# Patient Record
Sex: Male | Born: 1990 | Race: White | Hispanic: No | Marital: Single | Smoking: Never smoker
Health system: Southern US, Community
[De-identification: ages and names within clinical notes are randomized; demographics above are authoritative.]

## PROBLEM LIST (undated history)

## (undated) VITALS — BP 129/84 | HR 74 | Temp 97.7°F | Resp 18 | Ht 72.0 in | Wt 270.5 lb

## (undated) DIAGNOSIS — F329 Major depressive disorder, single episode, unspecified: Secondary | ICD-10-CM

## (undated) DIAGNOSIS — F32A Depression, unspecified: Secondary | ICD-10-CM

## (undated) DIAGNOSIS — F259 Schizoaffective disorder, unspecified: Secondary | ICD-10-CM

## (undated) DIAGNOSIS — F909 Attention-deficit hyperactivity disorder, unspecified type: Secondary | ICD-10-CM

## (undated) HISTORY — DX: Schizoaffective disorder, unspecified: F25.9

## (undated) HISTORY — PX: WISDOM TOOTH EXTRACTION: SHX21

## (undated) HISTORY — DX: Attention-deficit hyperactivity disorder, unspecified type: F90.9

## (undated) HISTORY — PX: CHOLECYSTECTOMY: SHX55

---
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
## 2010-01-02 ENCOUNTER — Ambulatory Visit (HOSPITAL_COMMUNITY): Payer: Self-pay | Admitting: Psychiatry

## 2010-01-16 ENCOUNTER — Ambulatory Visit (HOSPITAL_COMMUNITY): Payer: Self-pay | Admitting: Psychiatry

## 2010-01-20 ENCOUNTER — Emergency Department (HOSPITAL_COMMUNITY): Admission: EM | Admit: 2010-01-20 | Discharge: 2010-01-20 | Payer: Self-pay | Admitting: Emergency Medicine

## 2010-01-28 ENCOUNTER — Ambulatory Visit (HOSPITAL_COMMUNITY): Admission: RE | Admit: 2010-01-28 | Discharge: 2010-01-28 | Payer: Self-pay | Admitting: Gastroenterology

## 2010-02-03 ENCOUNTER — Encounter: Admission: RE | Admit: 2010-02-03 | Discharge: 2010-02-03 | Payer: Self-pay | Admitting: Gastroenterology

## 2010-02-06 ENCOUNTER — Ambulatory Visit (HOSPITAL_COMMUNITY): Payer: Self-pay | Admitting: Psychiatry

## 2010-02-19 ENCOUNTER — Emergency Department (HOSPITAL_COMMUNITY): Admission: EM | Admit: 2010-02-19 | Discharge: 2010-02-19 | Payer: Self-pay | Admitting: Emergency Medicine

## 2010-03-10 ENCOUNTER — Ambulatory Visit (HOSPITAL_COMMUNITY): Admission: RE | Admit: 2010-03-10 | Discharge: 2010-03-10 | Payer: Self-pay | Admitting: Surgery

## 2010-03-11 ENCOUNTER — Ambulatory Visit (HOSPITAL_COMMUNITY): Payer: Self-pay | Admitting: Psychiatry

## 2010-04-01 ENCOUNTER — Ambulatory Visit (HOSPITAL_COMMUNITY): Payer: Self-pay | Admitting: Psychiatry

## 2010-06-02 ENCOUNTER — Encounter (HOSPITAL_COMMUNITY): Payer: 59 | Admitting: Psychiatry

## 2010-06-02 DIAGNOSIS — F639 Impulse disorder, unspecified: Secondary | ICD-10-CM

## 2010-06-02 DIAGNOSIS — F333 Major depressive disorder, recurrent, severe with psychotic symptoms: Secondary | ICD-10-CM

## 2010-06-16 ENCOUNTER — Encounter (HOSPITAL_COMMUNITY): Payer: Self-pay | Admitting: Psychiatry

## 2010-06-17 ENCOUNTER — Encounter (HOSPITAL_COMMUNITY): Payer: 59 | Admitting: Psychiatry

## 2010-06-17 DIAGNOSIS — F3342 Major depressive disorder, recurrent, in full remission: Secondary | ICD-10-CM

## 2010-06-17 DIAGNOSIS — F411 Generalized anxiety disorder: Secondary | ICD-10-CM

## 2010-07-01 LAB — SURGICAL PCR SCREEN: Staphylococcus aureus: NEGATIVE

## 2010-07-01 LAB — COMPREHENSIVE METABOLIC PANEL
ALT: 36 U/L (ref 0–53)
Alkaline Phosphatase: 111 U/L (ref 39–117)
CO2: 26 mEq/L (ref 19–32)
GFR calc non Af Amer: >60 mL/min (ref >60)
Glucose, Bld: 99 mg/dL (ref 70–99)
Potassium: 3.9 mEq/L (ref 3.5–5.1)
Sodium: 138 mEq/L (ref 135–145)

## 2010-07-01 LAB — CBC
HCT: 40.3 % (ref 39.0–52.0)
Hemoglobin: 13.7 g/dL (ref 13.0–17.0)
WBC: 6.7 10*3/uL (ref 4.0–10.5)

## 2010-07-01 LAB — DIFFERENTIAL
Basophils Relative: 0 % (ref 0–1)
Eosinophils Absolute: 0.1 10*3/uL (ref 0.0–0.7)
Monocytes Relative: 5 % (ref 3–12)
Neutrophils Relative %: 54 % (ref 43–77)

## 2010-07-03 LAB — CBC
Hemoglobin: 15.6 g/dL (ref 13.0–17.0)
MCH: 27.6 pg (ref 26.0–34.0)
MCHC: 34.2 g/dL (ref 30.0–36.0)
MCV: 80.7 fL (ref 78.0–100.0)

## 2010-07-03 LAB — COMPREHENSIVE METABOLIC PANEL
AST: 43 U/L — ABNORMAL HIGH (ref 0–37)
BUN: 11 mg/dL (ref 6–23)
CO2: 24 mEq/L (ref 19–32)
Calcium: 9.4 mg/dL (ref 8.4–10.5)
Creatinine, Ser: 0.77 mg/dL (ref 0.4–1.5)
GFR calc Af Amer: >60 mL/min (ref >60)
GFR calc non Af Amer: >60 mL/min (ref >60)
Glucose, Bld: 105 mg/dL — ABNORMAL HIGH (ref 70–99)

## 2010-07-03 LAB — POCT CARDIAC MARKERS
CKMB, poc: 4.3 ng/mL (ref 1.0–8.0)
Myoglobin, poc: 129 ng/mL (ref 12–200)
Troponin i, poc: <0.05 ng/mL (ref 0.00–0.09)

## 2010-07-03 LAB — LIPASE, BLOOD: Lipase: 33 U/L (ref 11–59)

## 2010-07-03 LAB — DIFFERENTIAL
Basophils Absolute: 0 10*3/uL (ref 0.0–0.1)
Lymphocytes Relative: 36 % (ref 12–46)
Lymphs Abs: 2.2 10*3/uL (ref 0.7–4.0)
Neutro Abs: 3.3 10*3/uL (ref 1.7–7.7)
Neutrophils Relative %: 55 % (ref 43–77)

## 2010-07-06 LAB — BASIC METABOLIC PANEL
BUN: 18 mg/dL (ref 6–23)
Chloride: 108 mEq/L (ref 96–112)
GFR calc non Af Amer: >60 mL/min (ref >60)
Potassium: 3.5 mEq/L (ref 3.5–5.1)
Sodium: 139 mEq/L (ref 135–145)

## 2010-07-06 LAB — URINALYSIS, ROUTINE W REFLEX MICROSCOPIC
Bilirubin Urine: NEGATIVE
Hgb urine dipstick: NEGATIVE
Ketones, ur: NEGATIVE mg/dL
Nitrite: NEGATIVE
Urobilinogen, UA: 0.2 mg/dL (ref 0.0–1.0)

## 2010-07-06 LAB — CBC
HCT: 42.6 % (ref 39.0–52.0)
Hemoglobin: 14.8 g/dL (ref 13.0–17.0)
MCV: 80.8 fL (ref 78.0–100.0)
RBC: 5.28 MIL/uL (ref 4.22–5.81)
WBC: 10.3 10*3/uL (ref 4.0–10.5)

## 2010-07-06 LAB — COMPREHENSIVE METABOLIC PANEL
ALT: 33 U/L (ref 0–53)
Alkaline Phosphatase: 120 U/L — ABNORMAL HIGH (ref 39–117)
BUN: 15 mg/dL (ref 6–23)
CO2: 26 mEq/L (ref 19–32)
Calcium: 9.3 mg/dL (ref 8.4–10.5)
GFR calc non Af Amer: >60 mL/min (ref >60)
Glucose, Bld: 88 mg/dL (ref 70–99)
Total Protein: 6.9 g/dL (ref 6.0–8.3)

## 2010-07-06 LAB — SALICYLATE LEVEL: Salicylate Lvl: <4 mg/dL (ref 2.8–20.0)

## 2010-07-06 LAB — RAPID URINE DRUG SCREEN, HOSP PERFORMED
Amphetamines: NOT DETECTED
Barbiturates: NOT DETECTED
Opiates: NOT DETECTED

## 2010-07-06 LAB — DIFFERENTIAL
Eosinophils Absolute: 0.1 10*3/uL (ref 0.0–0.7)
Eosinophils Relative: 1 % (ref 0–5)
Lymphocytes Relative: 28 % (ref 12–46)
Lymphs Abs: 2.8 10*3/uL (ref 0.7–4.0)
Monocytes Absolute: 0.7 10*3/uL (ref 0.1–1.0)
Monocytes Relative: 7 % (ref 3–12)

## 2010-07-06 LAB — ETHANOL: Alcohol, Ethyl (B): <5 mg/dL (ref 0–10)

## 2010-07-06 LAB — HEPATIC FUNCTION PANEL
Alkaline Phosphatase: 121 U/L — ABNORMAL HIGH (ref 39–117)
Total Protein: 7.2 g/dL (ref 6.0–8.3)

## 2010-07-06 LAB — ACETAMINOPHEN LEVEL: Acetaminophen (Tylenol), Serum: <10 ug/mL — ABNORMAL LOW (ref 10–30)

## 2010-07-10 ENCOUNTER — Encounter (HOSPITAL_COMMUNITY): Payer: 59 | Admitting: Psychiatry

## 2010-07-10 DIAGNOSIS — F3342 Major depressive disorder, recurrent, in full remission: Secondary | ICD-10-CM

## 2010-07-10 DIAGNOSIS — F639 Impulse disorder, unspecified: Secondary | ICD-10-CM

## 2010-07-23 LAB — DIFFERENTIAL
Basophils Absolute: 0 10*3/uL (ref 0.0–0.1)
Basophils Relative: 1 % (ref 0–1)
Eosinophils Absolute: 0 10*3/uL (ref 0.0–1.2)
Eosinophils Relative: 1 % (ref 0–5)
Eosinophils Relative: 1 % (ref 0–5)
Lymphocytes Relative: 38 % (ref 24–48)
Lymphocytes Relative: 26 % (ref 24–48)
Lymphs Abs: 2.9 10*3/uL (ref 1.1–4.8)
Lymphs Abs: 2.4 10*3/uL (ref 1.1–4.8)
Monocytes Absolute: 0.5 10*3/uL (ref 0.2–1.2)
Monocytes Absolute: 0.6 10*3/uL (ref 0.2–1.2)
Monocytes Relative: 6 % (ref 3–11)
Neutro Abs: 4.1 10*3/uL (ref 1.7–8.0)
Neutro Abs: 6.4 10*3/uL (ref 1.7–8.0)
Neutrophils Relative %: 67 % (ref 43–71)

## 2010-07-23 LAB — COMPREHENSIVE METABOLIC PANEL
ALT: 55 U/L — ABNORMAL HIGH (ref 0–53)
ALT: 48 U/L (ref 0–53)
AST: 53 U/L — ABNORMAL HIGH (ref 0–37)
AST: 43 U/L — ABNORMAL HIGH (ref 0–37)
Albumin: 3.6 g/dL (ref 3.5–5.2)
Albumin: 3.4 g/dL — ABNORMAL LOW (ref 3.5–5.2)
Albumin: 3.7 g/dL (ref 3.5–5.2)
Albumin: 4.1 g/dL (ref 3.5–5.2)
Alkaline Phosphatase: 150 U/L (ref 52–171)
Alkaline Phosphatase: 128 U/L (ref 52–171)
Alkaline Phosphatase: 151 U/L (ref 52–171)
BUN: 12 mg/dL (ref 6–23)
BUN: 10 mg/dL (ref 6–23)
BUN: 15 mg/dL (ref 6–23)
CO2: 22 mEq/L (ref 19–32)
Calcium: 9 mg/dL (ref 8.4–10.5)
Calcium: 9.1 mg/dL (ref 8.4–10.5)
Chloride: 108 mEq/L (ref 96–112)
Chloride: 106 mEq/L (ref 96–112)
Creatinine, Ser: 0.86 mg/dL (ref 0.4–1.5)
Creatinine, Ser: 0.97 mg/dL (ref 0.4–1.5)
Glucose, Bld: 97 mg/dL (ref 70–99)
Glucose, Bld: 101 mg/dL — ABNORMAL HIGH (ref 70–99)
Potassium: 3.8 mEq/L (ref 3.5–5.1)
Potassium: 3.8 mEq/L (ref 3.5–5.1)
Potassium: 3.4 mEq/L — ABNORMAL LOW (ref 3.5–5.1)
Sodium: 141 mEq/L (ref 135–145)
Sodium: 136 mEq/L (ref 135–145)
Sodium: 136 mEq/L (ref 135–145)
Total Bilirubin: 0.7 mg/dL (ref 0.3–1.2)
Total Bilirubin: 0.3 mg/dL (ref 0.3–1.2)
Total Protein: 6.7 g/dL (ref 6.0–8.3)
Total Protein: 6.4 g/dL (ref 6.0–8.3)
Total Protein: 7.2 g/dL (ref 6.0–8.3)

## 2010-07-23 LAB — THYROID ANTIBODIES
Thyroglobulin Ab: 46.7 U/mL (ref 0.0–60.0)
Thyroperoxidase Ab SerPl-aCnc: 65 U/mL — ABNORMAL HIGH (ref 0.0–60.0)

## 2010-07-23 LAB — BASIC METABOLIC PANEL
CO2: 25 mEq/L (ref 19–32)
Chloride: 106 mEq/L (ref 96–112)
Chloride: 109 mEq/L (ref 96–112)
Creatinine, Ser: 0.93 mg/dL (ref 0.4–1.5)
Potassium: 3.6 mEq/L (ref 3.5–5.1)
Potassium: 3.8 mEq/L (ref 3.5–5.1)
Sodium: 140 mEq/L (ref 135–145)

## 2010-07-23 LAB — LIPID PANEL
Cholesterol: 187 mg/dL — ABNORMAL HIGH (ref 0–169)
LDL Cholesterol: 87 mg/dL (ref 0–109)
Total CHOL/HDL Ratio: 6.7 RATIO

## 2010-07-23 LAB — CBC
HCT: 44.8 % (ref 36.0–49.0)
Hemoglobin: 15.2 g/dL (ref 12.0–16.0)
MCHC: 34.5 g/dL (ref 31.0–37.0)
MCHC: 34 g/dL (ref 31.0–37.0)
MCV: 82 fL (ref 78.0–98.0)
MCV: 82.6 fL (ref 78.0–98.0)
Platelets: 188 10*3/uL (ref 150–400)
Platelets: 231 10*3/uL (ref 150–400)
RBC: 5.43 MIL/uL (ref 3.80–5.70)
RDW: 12.8 % (ref 11.4–15.5)
WBC: 9.5 10*3/uL (ref 4.5–13.5)

## 2010-07-23 LAB — BARBITURATE, URINE, CONFIRMATION
Butabarbital UR Quant: NEGATIVE
Pentobarbital GC/MS Conf: NEGATIVE
Secobarbital GC/MS Conf: NEGATIVE

## 2010-07-23 LAB — DRUGS OF ABUSE SCREEN W/O ALC, ROUTINE URINE
Cocaine Metabolites: NEGATIVE
Marijuana Metabolite: NEGATIVE
Opiate Screen, Urine: NEGATIVE
Phencyclidine (PCP): NEGATIVE
Propoxyphene: NEGATIVE

## 2010-07-23 LAB — HEPATIC FUNCTION PANEL
ALT: 67 U/L — ABNORMAL HIGH (ref 0–53)
AST: 54 U/L — ABNORMAL HIGH (ref 0–37)
Albumin: 3.8 g/dL (ref 3.5–5.2)
Alkaline Phosphatase: 141 U/L (ref 52–171)
Bilirubin, Direct: 0.1 mg/dL (ref 0.0–0.3)
Bilirubin, Direct: <0.1 mg/dL (ref 0.0–0.3)
Indirect Bilirubin: 0.4 mg/dL (ref 0.3–0.9)
Indirect Bilirubin: 0.8 mg/dL (ref 0.3–0.9)
Total Bilirubin: 0.9 mg/dL (ref 0.3–1.2)
Total Protein: 6.4 g/dL (ref 6.0–8.3)
Total Protein: 6.7 g/dL (ref 6.0–8.3)
Total Protein: 7 g/dL (ref 6.0–8.3)

## 2010-07-23 LAB — GAMMA GT
GGT: 41 U/L (ref 7–51)
GGT: 44 U/L (ref 7–51)

## 2010-07-23 LAB — URINALYSIS, MICROSCOPIC ONLY
Ketones, ur: NEGATIVE mg/dL
Leukocytes, UA: NEGATIVE
Nitrite: NEGATIVE
Protein, ur: NEGATIVE mg/dL
Urobilinogen, UA: 0.2 mg/dL (ref 0.0–1.0)

## 2010-07-23 LAB — RAPID URINE DRUG SCREEN, HOSP PERFORMED
Amphetamines: NOT DETECTED
Barbiturates: NOT DETECTED
Benzodiazepines: NOT DETECTED
Cocaine: NOT DETECTED
Opiates: NOT DETECTED
Tetrahydrocannabinol: NOT DETECTED

## 2010-07-23 LAB — HEMOGLOBIN A1C: Mean Plasma Glucose: 105 mg/dL

## 2010-07-23 LAB — T3, FREE: T3, Free: 3 pg/mL (ref 2.3–4.2)

## 2010-07-23 LAB — T4, FREE: Free T4: 1.1 ng/dL (ref 0.80–1.80)

## 2010-07-23 LAB — LIPASE, BLOOD: Lipase: 13 U/L (ref 11–59)

## 2010-07-30 LAB — LIPID PANEL
Cholesterol: 162 mg/dL (ref 0–169)
LDL Cholesterol: 108 mg/dL (ref 0–109)

## 2010-07-30 LAB — DIFFERENTIAL
Basophils Absolute: 0 10*3/uL (ref 0.0–0.1)
Lymphocytes Relative: 36 % (ref 24–48)
Lymphs Abs: 3 10*3/uL (ref 1.1–4.8)
Neutro Abs: 4.6 10*3/uL (ref 1.7–8.0)

## 2010-07-30 LAB — COMPREHENSIVE METABOLIC PANEL
AST: 28 U/L (ref 0–37)
CO2: 25 mEq/L (ref 19–32)
Calcium: 9.4 mg/dL (ref 8.4–10.5)
Creatinine, Ser: 0.79 mg/dL (ref 0.4–1.5)
Glucose, Bld: 97 mg/dL (ref 70–99)
Sodium: 141 mEq/L (ref 135–145)
Total Protein: 6.4 g/dL (ref 6.0–8.3)

## 2010-07-30 LAB — CBC
MCHC: 34 g/dL (ref 31.0–37.0)
MCV: 82 fL (ref 78.0–98.0)
RBC: 5.4 MIL/uL (ref 3.80–5.70)
RDW: 13.4 % (ref 11.4–15.5)

## 2010-07-30 LAB — URINALYSIS, ROUTINE W REFLEX MICROSCOPIC
Bilirubin Urine: NEGATIVE
Glucose, UA: NEGATIVE mg/dL
Hgb urine dipstick: NEGATIVE
Ketones, ur: NEGATIVE mg/dL
Nitrite: NEGATIVE
Protein, ur: NEGATIVE mg/dL
Urobilinogen, UA: 0.2 mg/dL (ref 0.0–1.0)
pH: 6 (ref 5.0–8.0)

## 2010-07-30 LAB — CORTISOL-AM, BLOOD: Cortisol - AM: 18.2 ug/dL (ref 4.3–22.4)

## 2010-07-30 LAB — DRUGS OF ABUSE SCREEN W/O ALC, ROUTINE URINE
Amphetamine Screen, Ur: NEGATIVE
Barbiturate Quant, Ur: NEGATIVE
Creatinine,U: 213.5 mg/dL

## 2010-07-30 LAB — T4, FREE: Free T4: 1.16 ng/dL (ref 0.80–1.80)

## 2010-07-30 LAB — HEMOGLOBIN A1C: Mean Plasma Glucose: 105 mg/dL

## 2010-07-30 LAB — TSH: TSH: 1.276 u[IU]/mL (ref 0.350–4.500)
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
## 2010-09-02 NOTE — Procedures (Signed)
EEG NUMBER:  [id number].   REFERRING PHYSICIAN:  Dr. Marlyne Beards.   CLINICAL HISTORY:  A 20 year old male being evaluated for depression,  hallucinations, and possible seizures.   MEDICATION LISTED:  Tylenol, Maalox, vitamin A and D.   This is a portable EEG recorded with the patient awake and asleep using  17-channel machine and standard 10/20 electrode placement.   Background awake rhythm consists of 9-10 Hz alpha, which is of moderate  amplitude, synchronous, reactive to eye opening and closure.  No  paroxysmal epileptiform activity, spikes, or sharp waves are noted.  Stages I and II of light sleep achieved naturally and show normal  physiological findings.  Length of the recording is 21 minutes.  Technical component is average.  EKG tracing reveals regular sinus  rhythm.  Hyperventilation and photic stimulation not performed this  being a portable study.   IMPRESSION:  This EEG performed during awake and light sleep states is  within normal limits.  No definite epileptiform features were noted.           ______________________________  Alejandro Murphy. Pearlean Brownie, MD     UEA:VWUJ  D:  08/03/2008 17:41:08  T:  08/04/2008 10:16:55  Job #:  [id number]
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
## 2010-09-04 ENCOUNTER — Encounter (HOSPITAL_COMMUNITY): Payer: 59 | Admitting: Psychiatry

## 2010-10-06 ENCOUNTER — Encounter (HOSPITAL_COMMUNITY): Payer: 59 | Admitting: Psychiatry

## 2011-08-26 IMAGING — NM NM HEPATO W/GB/PHARM/[PERSON_NAME]
1 series · 12 of 12 positions shown · non-contrast
Comparison: Ultrasound abdomen of 01/20/2010

CLINICAL DATA: Abdominal pain, nausea, vomiting

NUCLEAR MEDICINE HEPATOBILIARY IMAGING WITH GALLBLADDER EF
TECHNIQUE: Sequential images of the abdomen were obtained [DATE] minutes following intravenous administration of
radiopharmaceutical.  After slow intravenous infusion of
micrograms Cholecystokinin, gallbladder ejection fraction was
determined.
Radiopharmaceutical:  5.1 mCi Uc-44m Choletec

[Series 0: hepatobiliary · 3.20mm/px · 2 acquisitions, 12 frames shown]
[im 1/2]
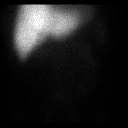
[im 1/2]
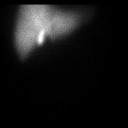
[im 1/2]
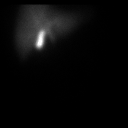
[im 1/2]
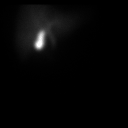
[im 1/2]
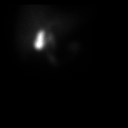
[im 1/2]
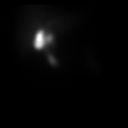
[im 2/2]
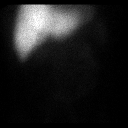
[im 2/2]
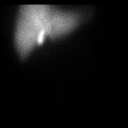
[im 2/2]
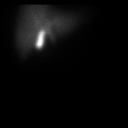
[im 2/2]
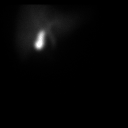
[im 2/2]
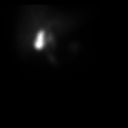
[im 2/2]
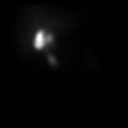

[12 of 12 positions shown; findings below may reference images not displayed]

FINDINGS: The patient was injected with 5.1 mCi of technetium 99m
Choletec intravenously and imaging over the upper abdomen was
performed.  The radionuclide appears within the liver with
excretion into the intrahepatic ductal system.  There is
visualization of the common bile duct, gallbladder, and small
bowel. This represents a normal nuclear medicine hepatobiliary
scan.

The patient was then given 2.6 mcg of CCK intravenously and imaging
over the gallbladder was performed.  At 30 minutes the gallbladder
ejection fraction is measured at 21.8% with normal limits being 30%
or greater.

The patient did not experience symptoms during CCK infusion.
IMPRESSION: 1.  Normal nuclear medicine hepatobiliary scan.
2.  Abnormal gallbladder ejection fraction measured at 21.8% at 30
minutes.

## 2011-10-06 IMAGING — RF DG CHOLANGIOGRAM OPERATIVE
1 series · 4 of 4 positions shown · non-contrast
Comparison: 02/03/2010

CLINICAL DATA: Biliary dyskinesia

INTRAOPERATIVE CHOLANGIOGRAM
TECHNIQUE: Cholangiographic images from the C-arm fluoroscopic
device were submitted for interpretation post-operatively.  Please
see the procedural report for the amount of contrast and the
fluoroscopy time utilized.

[Series 1: run · 4 of 110 frames shown]
[frame 17/110]
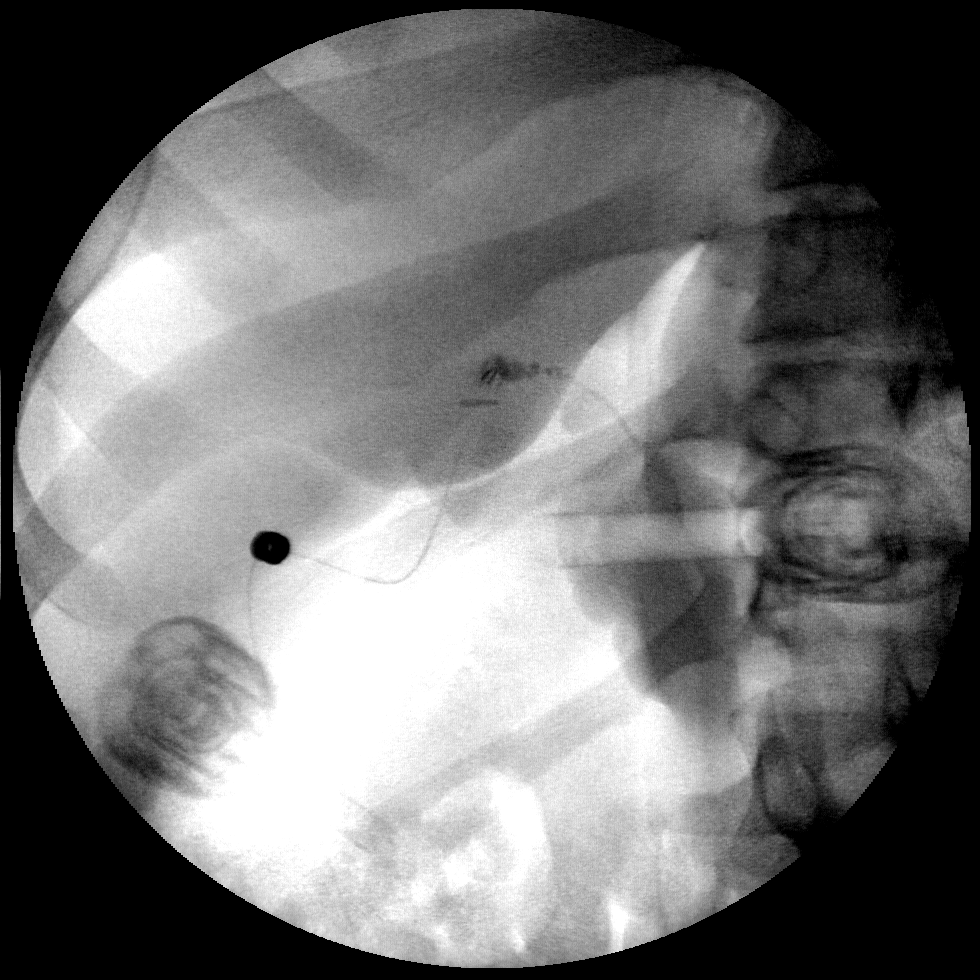
[frame 35/110]
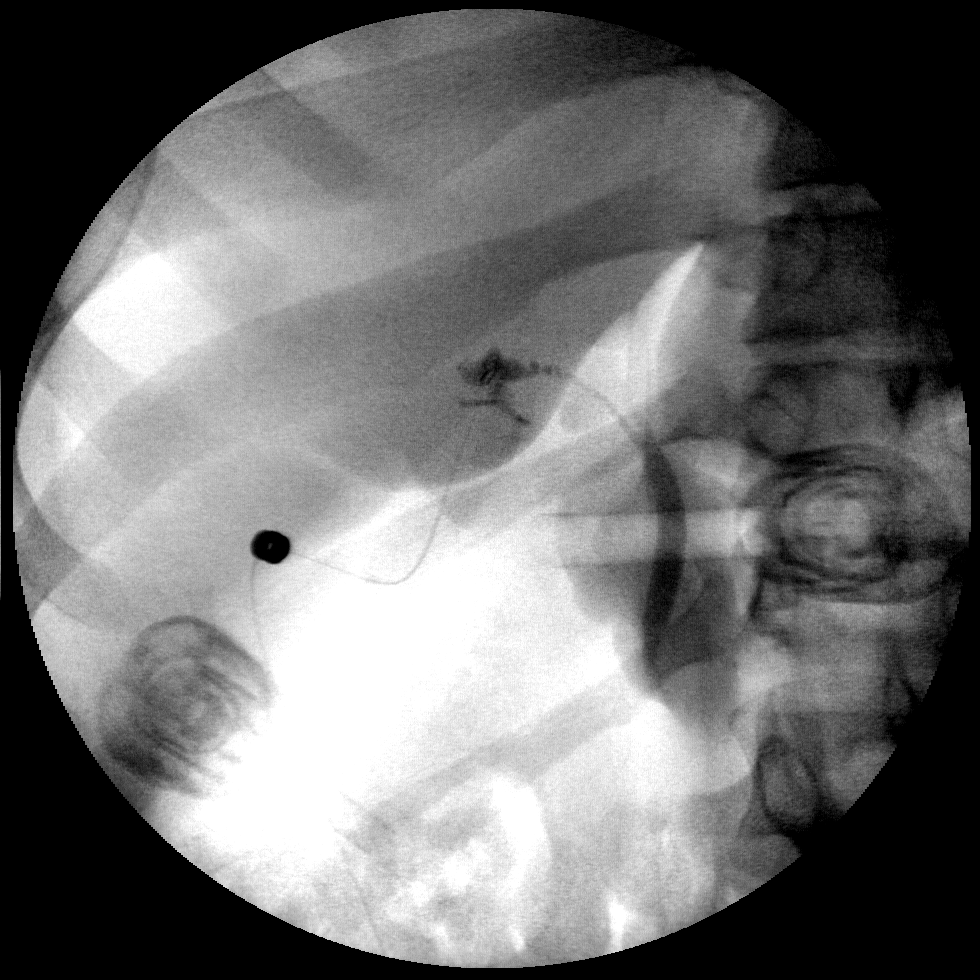
[frame 56/110]
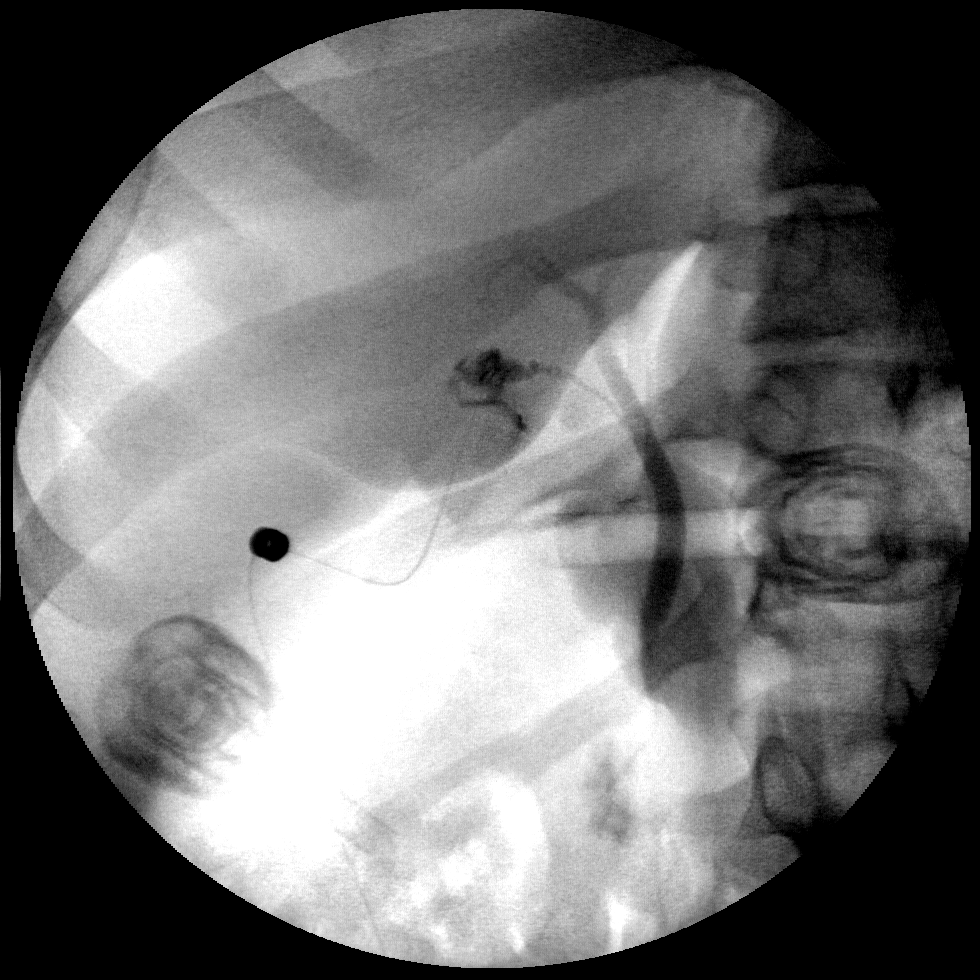
[frame 94/110]
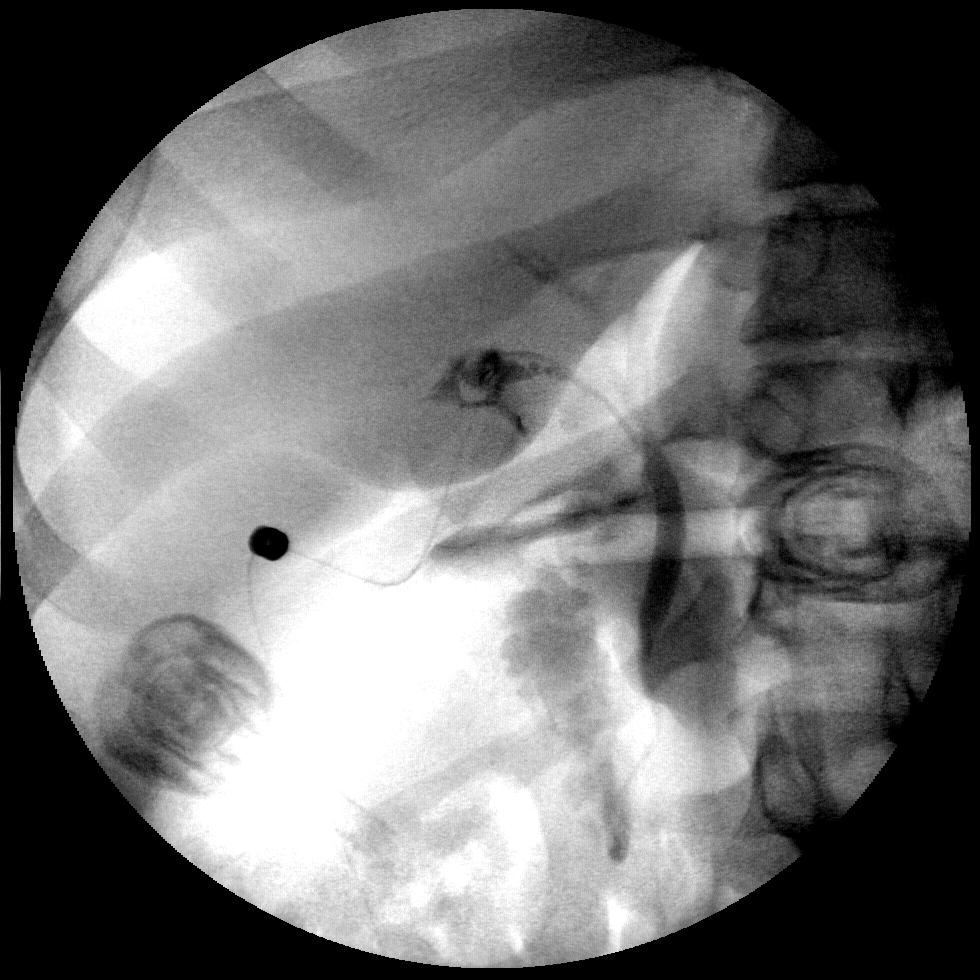

[4 of 4 positions shown; findings below may reference images not displayed]

FINDINGS: Intraoperative cholangiogram performed following
laparoscopic cholecystectomy.  The cystic duct remnant, biliary
confluence, common hepatic duct and common bile duct are all
patent.  Contrast opacifies the duodenum.  No biliary dilatation.
Leakage of contrast noted at the injection site along the cystic
duct clips.
IMPRESSION: Patent biliary system.

## 2012-06-17 ENCOUNTER — Encounter (HOSPITAL_COMMUNITY): Payer: Self-pay | Admitting: *Deleted

## 2012-06-17 ENCOUNTER — Inpatient Hospital Stay (HOSPITAL_COMMUNITY)
Admission: AD | Admit: 2012-06-17 | Discharge: 2012-06-22 | DRG: 885 | Disposition: A | Discharge status: Home / Self Care | Payer: 59 | Attending: Psychiatry | Admitting: Psychiatry

## 2012-06-17 ENCOUNTER — Telehealth (HOSPITAL_COMMUNITY): Payer: Self-pay

## 2012-06-17 DIAGNOSIS — F411 Generalized anxiety disorder: Secondary | ICD-10-CM | POA: Diagnosis present

## 2012-06-17 DIAGNOSIS — F331 Major depressive disorder, recurrent, moderate: Principal | ICD-10-CM

## 2012-06-17 DIAGNOSIS — Z79899 Other long term (current) drug therapy: Secondary | ICD-10-CM

## 2012-06-17 DIAGNOSIS — R45851 Suicidal ideations: Secondary | ICD-10-CM

## 2012-06-17 DIAGNOSIS — F3130 Bipolar disorder, current episode depressed, mild or moderate severity, unspecified: Secondary | ICD-10-CM | POA: Diagnosis present

## 2012-06-17 HISTORY — DX: Depression, unspecified: F32.A

## 2012-06-17 HISTORY — DX: Major depressive disorder, single episode, unspecified: F32.9

## 2012-06-17 LAB — URINALYSIS, ROUTINE W REFLEX MICROSCOPIC
Glucose, UA: NEGATIVE mg/dL
Hgb urine dipstick: NEGATIVE
Leukocytes, UA: NEGATIVE
Protein, ur: NEGATIVE mg/dL
Specific Gravity, Urine: 1.027 (ref 1.005–1.030)
Urobilinogen, UA: 0.2 mg/dL (ref 0.0–1.0)

## 2012-06-17 LAB — RAPID URINE DRUG SCREEN, HOSP PERFORMED
Amphetamines: NOT DETECTED
Cocaine: NOT DETECTED
Opiates: NOT DETECTED
Tetrahydrocannabinol: NOT DETECTED

## 2012-06-17 MED ORDER — ALUM & MAG HYDROXIDE-SIMETH 200-200-20 MG/5ML PO SUSP
30.0000 mL | ORAL | Status: DC | PRN
  Administered 2012-06-19: 30 mL via ORAL

## 2012-06-17 MED ORDER — MAGNESIUM HYDROXIDE 400 MG/5ML PO SUSP: 30.0000 mL | Freq: Every day | ORAL | Status: DC | PRN

## 2012-06-17 MED ORDER — ACETAMINOPHEN 325 MG PO TABS
650.0000 mg | ORAL_TABLET | Freq: Four times a day (QID) | ORAL | Status: DC | PRN
Start: 2012-06-17 — End: 2012-06-22
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
## 2012-06-17 NOTE — Progress Notes (Signed)
Patient has been up in the dayroom watching tv interacting with peers appropriately. Patient attended group this evening and reports that his goal is find better methods to deal with stress once he leaves the hospital. Patient currently denies si/hi/a/v hallucinations. Support and encouragement offered, safety maintained with 15 min checks, will continue to monitor.
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
## 2012-06-17 NOTE — Progress Notes (Signed)
D: Patient appropriate and cooperative with staff and peers.   A: Support and encouragement provided to patient. Monitor Q15 minute checks for safety.  R: Patient receptive. Denies SI/HI/AVH. Patient remains safe.

## 2012-06-18 DIAGNOSIS — F3130 Bipolar disorder, current episode depressed, mild or moderate severity, unspecified: Secondary | ICD-10-CM | POA: Diagnosis present

## 2012-06-18 DIAGNOSIS — F332 Major depressive disorder, recurrent severe without psychotic features: Secondary | ICD-10-CM

## 2012-06-18 DIAGNOSIS — F411 Generalized anxiety disorder: Secondary | ICD-10-CM

## 2012-06-18 LAB — LIPID PANEL
Cholesterol: 177 mg/dL (ref 0–200)
HDL: 27 mg/dL — ABNORMAL LOW (ref >39)
Triglycerides: 214 mg/dL — ABNORMAL HIGH (ref <150)

## 2012-06-18 LAB — COMPREHENSIVE METABOLIC PANEL
CO2: 25 mEq/L (ref 19–32)
Calcium: 9.3 mg/dL (ref 8.4–10.5)
Creatinine, Ser: 0.69 mg/dL (ref 0.50–1.35)
GFR calc Af Amer: >90 mL/min (ref >90)
GFR calc non Af Amer: >90 mL/min (ref >90)
Glucose, Bld: 95 mg/dL (ref 70–99)
Sodium: 138 mEq/L (ref 135–145)
Total Protein: 7.4 g/dL (ref 6.0–8.3)

## 2012-06-18 LAB — CBC
Hemoglobin: 15.1 g/dL (ref 13.0–17.0)
MCH: 27.5 pg (ref 26.0–34.0)
MCHC: 34.3 g/dL (ref 30.0–36.0)
MCV: 80 fL (ref 78.0–100.0)
RBC: 5.5 MIL/uL (ref 4.22–5.81)

## 2012-06-18 LAB — ETHANOL: Alcohol, Ethyl (B): <11 mg/dL (ref 0–11)

## 2012-06-18 MED ORDER — SERTRALINE HCL 25 MG PO TABS
25.0000 mg | ORAL_TABLET | Freq: Every day | ORAL | Status: DC
  Administered 2012-06-18 – 2012-06-20 (×3): 25 mg via ORAL
  Filled 2012-06-18 (×4): qty 1

## 2012-06-18 NOTE — Progress Notes (Signed)
D Kalonji ( who requests to be called " Alejandro Murphy" ) is seen OOB UAL on the 500 hall today, [facility]. He is depressed and sad and does not initiate conversation. HE does deny SI within the past 24 hrs, he rates his depression a " 2 " and does not know what his DC paln is at this point.   AHe is started on 25 mg of zoloft PO, he has not requested prn medication and is attending his groups as planned.   R Safety is in place and POC miantained.

## 2012-06-18 NOTE — Progress Notes (Signed)
[facility] Group Notes:  (Nursing/MHT/Case Management/Adjunct)  Date:  06/17/2012 Time:  2000  Type of Therapy:  Psychoeducational Skills  Participation Level:  Minimal  Participation Quality:  Inattentive  Affect:  Blunted  Cognitive:  Lacking  Insight:  Lacking  Engagement in Group:  Lacking  Modes of Intervention:  Education  Summary of Progress/Problems: The patient did not have anything to share with the group regarding his day. His  goal for tomorrow is to work on finding more coping skills. He mentioned that he feels overwhelmed and stressed out and that he needs to deal with this more appropriately.   Hazle Coca S 06/18/2012, 12:42 AM

## 2012-06-18 NOTE — Progress Notes (Signed)
Psychoeducational Group Note  Date:  06/18/2012 Time: 1015  Group Topic/Focus:  Identifying Needs:   The focus of this group is to help patients identify their personal needs that have been historically problematic and identify healthy behaviors to address their needs.  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

## 2012-06-18 NOTE — [facility] Suicide Risk Assessment (Signed)
Suicide Risk Assessment  Admission Assessment     Nursing information obtained from:  Patient Demographic factors:  Male;Adolescent or young adult Current Mental Status:  Suicidal ideation indicated by patient;Suicidal ideation indicated by others Loss Factors:  Financial problems / change in socioeconomic status Historical Factors:  Impulsivity Risk Reduction Factors:  Sense of responsibility to family;Religious beliefs about death;Living with another person, especially a relative;Positive social support  CLINICAL FACTORS:   Depression:   Anhedonia Hopelessness Insomnia Recent sense of peace/wellbeing Severe  COGNITIVE FEATURES THAT CONTRIBUTE TO RISK:  Loss of executive function Polarized thinking    SUICIDE RISK:   Moderate:  Frequent suicidal ideation with limited intensity, and duration, some specificity in terms of plans, no associated intent, good self-control, limited dysphoria/symptomatology, some risk factors present, and identifiable protective factors, including available and accessible social support.  PLAN OF CARE:  I certify that inpatient services furnished can reasonably be expected to improve the patient's condition.  Makaila Windle T. 06/18/2012, 11:56 AM

## 2012-06-18 NOTE — Progress Notes (Signed)
Patient has been up and active on the unit, attended group this evening and has voiced no complaints. Patient currently denies having pain, -si/hi/a/v hall. Support and encouragement offered, safety maintained on unit, will continue to monitor.  
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
## 2012-06-18 NOTE — Clinical Social Work Note (Addendum)
[facility] Group Notes:  (Clinical Social Work)  06/18/2012   3:00-4:00PM  Summary of Progress/Problems:   The main focus of today's process group was for the patient to identify something in their life that led to their hospitalization that they would like to change, then to discuss their motivation to change.  The Stages of Change were explained to the group, then each patient identified where they are in that process and what actions they are taking toward change within that stage.  The patient expressed he is in the hospital to reinforce his peaceful side, because there is a side of him that becomes very afraid, at which point he explodes in a manner in which he cannot control.  He rages, and is forceful and becomes scary himself.  Therefore he wants to change how he reacts to fear, and how he expresses his anger.  He states that he is in preparation stage and his motivation is 8 out of 10.  Type of Therapy:  Process Group  Participation Level:  Active  Participation Quality:  Attentive, Sharing and Supportive  Affect:  Blunted and Depressed  Cognitive:  Appropriate and Oriented  Insight:  Engaged  Engagement in Therapy:  Engaged  Modes of Intervention:  Clarification, Support and Processing, Exploration, Discussion   Ambrose Mantle, LCSW 06/18/2012, 4:31 PM

## 2012-06-18 NOTE — Progress Notes (Addendum)
Goals  Group Note  Date:  11/28/2011 Time:  0930  Group Topic/Focus:  Identifying  Goals : The  Group is focused on helping the patients  identify goals  They want to obtain as well as to motivate them to make positive change in their lives.  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

## 2012-06-19 DIAGNOSIS — F329 Major depressive disorder, single episode, unspecified: Secondary | ICD-10-CM

## 2012-06-19 DIAGNOSIS — F3289 Other specified depressive episodes: Secondary | ICD-10-CM

## 2012-06-19 MED ORDER — HYDROXYZINE HCL 50 MG PO TABS
50.0000 mg | ORAL_TABLET | Freq: Every evening | ORAL | Status: DC | PRN
  Administered 2012-06-19: 50 mg via ORAL
  Filled 2012-06-19: qty 1

## 2012-06-19 NOTE — Progress Notes (Signed)
Psychoeducational Group Note  Date:  06/19/2012 Time:  1015  Group Topic/Focus:  Making Healthy Choices:   The focus of this group is to help patients identify negative/unhealthy choices they were using prior to admission and identify positive/healthier coping strategies to replace them upon discharge.  Participation Level:  Active  Participation Quality:  Appropriate  Affect:  Appropriate  Cognitive:  Alert  Insight:  Improving  Engagement in Group:  Improving  Additional Comments:    Neomia Herbel A 

## 2012-06-19 NOTE — Progress Notes (Signed)
[facility] Group Notes:  (Nursing/MHT/Case Management/Adjunct)  Date:  06/19/2012  Time:  2000  Type of Therapy:  Psychoeducational Skills  Participation Level:  Active  Participation Quality:  Attentive  Affect:  Appropriate  Cognitive:  Appropriate  Insight:  Improving  Engagement in Group:  Engaged  Modes of Intervention:  Education  Summary of Progress/Problems: Patient stated that he had a "great" day. He stated that he felt calmer than yesterday. In addition, he felt that his mood had improved. His goal for tomorrow is to continue to progress with his mood.   Hazle Coca S 06/19/2012, 10:49 PM

## 2012-06-19 NOTE — Progress Notes (Signed)
[facility] MD Progress Note  06/19/2012 11:51 AM Alejandro Murphy  MRN:  [id number] Subjective:  "I did not sleep well--single sleeper, only child--getting use to that (having someone in the same room), weird to have stranger in the room.  Meds seem to be helping a little bit."  2/10 depression with no anxiety or suicidal ideations. Diagnosis:   Axis I: Depressive Disorder NOS Axis II: Deferred Axis III:  Past Medical History  Diagnosis Date  . Depression    Axis IV: economic problems, occupational problems, other psychosocial or environmental problems, problems related to social environment and problems with primary support group Axis V: 41-50 serious symptoms  ADL's:  Intact  Sleep: Poor  Appetite:  Good  Suicidal Ideation:  Denies Homicidal Ideation:  Denies  Psychiatric Specialty Exam: Review of Systems  Constitutional: Negative.   HENT: Negative.   Eyes: Negative.   Respiratory: Negative.   Cardiovascular: Negative.   Gastrointestinal: Negative.   Genitourinary: Negative.   Musculoskeletal: Negative.   Skin: Negative.   Neurological: Negative.   Endo/Heme/Allergies: Negative.   Psychiatric/Behavioral: Positive for depression. The patient is nervous/anxious.     Blood pressure 130/86, pulse 73, temperature 97.8 F (36.6 C), temperature source Oral, resp. rate 18, height 6' (1.829 m), weight 122.698 kg (270 lb 8 oz).Body mass index is 36.68 kg/(m^2).  General Appearance: Casual  Eye Contact::  Fair  Speech:  Normal Rate  Volume:  Normal  Mood:  Depressed  Affect:  Congruent  Thought Process:  Coherent  Orientation:  Full (Time, Place, and Person)  Thought Content:  WDL  Suicidal Thoughts:  No  Homicidal Thoughts:  No  Memory:  Immediate;   Fair Recent;   Fair Remote;   Fair  Judgement:  Fair  Insight:  Fair  Psychomotor Activity:  Normal  Concentration:  Fair  Recall:  Fair  Akathisia:  No  Handed:  Right  AIMS (if indicated):     Assets:   Housing Resilience Social Support  Sleep:  Number of Hours: 5.25   Current Medications: Current Facility-Administered Medications  Medication Dose Route Frequency Provider Last Rate Last Dose  . acetaminophen (TYLENOL) tablet 650 mg  650 mg Oral Q6H PRN Shuvon Rankin, NP      . alum & mag hydroxide-simeth (MAALOX/MYLANTA) 200-200-20 MG/5ML suspension 30 mL  30 mL Oral Q4H PRN Shuvon Rankin, NP   30 mL at 06/19/12 4098  . magnesium hydroxide (MILK OF MAGNESIA) suspension 30 mL  30 mL Oral Daily PRN Shuvon Rankin, NP      . sertraline (ZOLOFT) tablet 25 mg  25 mg Oral Daily Nanine Means, NP   25 mg at 06/19/12 1191    Lab Results:  Results for orders placed during the hospital encounter of 06/17/12 (from the past 48 hour(s))  URINE RAPID DRUG SCREEN (HOSP PERFORMED)     Status: None   Collection Time    06/17/12  6:23 PM      Result Value Range   Opiates NONE DETECTED  NONE DETECTED   Cocaine NONE DETECTED  NONE DETECTED   Benzodiazepines NONE DETECTED  NONE DETECTED   Amphetamines NONE DETECTED  NONE DETECTED   Tetrahydrocannabinol NONE DETECTED  NONE DETECTED   Barbiturates NONE DETECTED  NONE DETECTED   Comment:            DRUG SCREEN FOR MEDICAL PURPOSES     ONLY.  IF CONFIRMATION IS NEEDED     FOR ANY PURPOSE, NOTIFY LAB  WITHIN 5 DAYS.                LOWEST DETECTABLE LIMITS     FOR URINE DRUG SCREEN     Drug Class       Cutoff (ng/mL)     Amphetamine      1000     Barbiturate      200     Benzodiazepine   200     Tricyclics       300     Opiates          300     Cocaine          300     THC              50  URINALYSIS, ROUTINE W REFLEX MICROSCOPIC     Status: Abnormal   Collection Time    06/17/12  6:24 PM      Result Value Range   Color, Urine YELLOW  YELLOW   APPearance CLOUDY (*) CLEAR   Specific Gravity, Urine 1.027  1.005 - 1.030   pH 5.5  5.0 - 8.0   Glucose, UA NEGATIVE  NEGATIVE mg/dL   Hgb urine dipstick NEGATIVE  NEGATIVE   Bilirubin Urine  NEGATIVE  NEGATIVE   Ketones, ur NEGATIVE  NEGATIVE mg/dL   Protein, ur NEGATIVE  NEGATIVE mg/dL   Urobilinogen, UA 0.2  0.0 - 1.0 mg/dL   Nitrite NEGATIVE  NEGATIVE   Leukocytes, UA NEGATIVE  NEGATIVE   Comment: MICROSCOPIC NOT DONE ON URINES WITH NEGATIVE PROTEIN, BLOOD, LEUKOCYTES, NITRITE, OR GLUCOSE <1000 mg/dL.  CBC     Status: None   Collection Time    06/18/12  6:40 AM      Result Value Range   WBC 7.7  4.0 - 10.5 K/uL   RBC 5.50  4.22 - 5.81 MIL/uL   Hemoglobin 15.1  13.0 - 17.0 g/dL   HCT 16.1  09.6 - 04.5 %   MCV 80.0  78.0 - 100.0 fL   MCH 27.5  26.0 - 34.0 pg   MCHC 34.3  30.0 - 36.0 g/dL   RDW 40.9  81.1 - 91.4 %   Platelets 220  150 - 400 K/uL  COMPREHENSIVE METABOLIC PANEL     Status: None   Collection Time    06/18/12  6:40 AM      Result Value Range   Sodium 138  135 - 145 mEq/L   Potassium 3.8  3.5 - 5.1 mEq/L   Chloride 103  96 - 112 mEq/L   CO2 25  19 - 32 mEq/L   Glucose, Bld 95  70 - 99 mg/dL   BUN 12  6 - 23 mg/dL   Creatinine, Ser 7.82  0.50 - 1.35 mg/dL   Calcium 9.3  8.4 - 95.6 mg/dL   Total Protein 7.4  6.0 - 8.3 g/dL   Albumin 3.8  3.5 - 5.2 g/dL   AST 28  0 - 37 U/L   ALT 28  0 - 53 U/L   Alkaline Phosphatase 108  39 - 117 U/L   Total Bilirubin 0.4  0.3 - 1.2 mg/dL   GFR calc non Af Amer >90  >90 mL/min   GFR calc Af Amer >90  >90 mL/min   Comment:            The eGFR has been calculated     using the CKD EPI equation.     This calculation  has not been     validated in all clinical     situations.     eGFR's persistently     <90 mL/min signify     possible Chronic Kidney Disease.  ETHANOL     Status: None   Collection Time    06/18/12  6:40 AM      Result Value Range   Alcohol, Ethyl (B) <11  0 - 11 mg/dL   Comment:            LOWEST DETECTABLE LIMIT FOR     SERUM ALCOHOL IS 11 mg/dL     FOR MEDICAL PURPOSES ONLY  LIPID PANEL     Status: Abnormal   Collection Time    06/18/12  6:40 AM      Result Value Range   Cholesterol 177   0 - 200 mg/dL   Triglycerides 784 (*) <150 mg/dL   HDL 27 (*) >69 mg/dL   Total CHOL/HDL Ratio 6.6     VLDL 43 (*) 0 - 40 mg/dL   LDL Cholesterol 629 (*) 0 - 99 mg/dL   Comment:            Total Cholesterol/HDL:CHD Risk     Coronary Heart Disease Risk Table                         Men   Women      1/2 Average Risk   3.4   3.3      Average Risk       5.0   4.4      2 X Average Risk   9.6   7.1      3 X Average Risk  23.4   11.0                Use the calculated Patient Ratio     above and the CHD Risk Table     to determine the patient's CHD Risk.                ATP III CLASSIFICATION (LDL):      <100     mg/dL   Optimal      528-413  mg/dL   Near or Above                        Optimal      130-159  mg/dL   Borderline      244-010  mg/dL   High      >272     mg/dL   Very High  MAGNESIUM     Status: None   Collection Time    06/18/12  6:40 AM      Result Value Range   Magnesium 2.0  1.5 - 2.5 mg/dL  TSH     Status: None   Collection Time    06/18/12  6:40 AM      Result Value Range   TSH 0.899  0.350 - 4.500 uIU/mL    Physical Findings: AIMS: Facial and Oral Movements Muscles of Facial Expression: None, normal Lips and Perioral Area: None, normal Jaw: None, normal Tongue: None, normal,Extremity Movements Upper (arms, wrists, hands, fingers): None, normal Lower (legs, knees, ankles, toes): None, normal, Trunk Movements Neck, shoulders, hips: None, normal, Overall Severity Severity of abnormal movements (highest score from questions above): None, normal Incapacitation due to abnormal movements: None, normal Patient's awareness of abnormal movements (rate only patient's  report): No Awareness, Dental Status Current problems with teeth and/or dentures?: No Does patient usually wear dentures?: No  CIWA:    COWS:     Treatment Plan Summary: Daily contact with patient to assess and evaluate symptoms and progress in treatment Medication management  Plan:  Review of  chart, vital signs, medications, and notes. 1-Individual and group therapy 2-Medication management for depression and anxiety:  Medications reviewed with the patient and no changes made 3-Coping skills for depression and anxiety 4-Continue crisis stabilization and management 5-Address health issues--monitoring vital signs, stable 6-Treatment plan in progress to prevent relapse of depression and anxiety  Medical Decision Making Problem Points:  Established problem, stable/improving (1) and Review of psycho-social stressors (1) Data Points:  Review of medication regiment & side effects (2)  I certify that inpatient services furnished can reasonably be expected to improve the patient's condition.   Nanine Means, PMH-NP 06/19/2012, 11:51 AM

## 2012-06-19 NOTE — Progress Notes (Signed)
Writer spoke with patient earlier and he reports that his day has gone well and he has been playing basketball the past few days and has been getting his exercise in. Patient has been up and active on the unit and attended group. Patient asked if he had medication for sleep tonight since he did not rest well due to his roommates loud snoring. Writer called doctor on call and obtained an order for visteril to aid with sleep. Patient reports feeling slightly anxious when we were talking. Patient offered support and encouragement, denies si/hi/a/v hallucinations. Safety maintained on unit with 15 min checks, will continue to monitor.

## 2012-06-19 NOTE — Clinical Social Work Note (Signed)
[facility] Group Notes: (Clinical Social Work)   06/19/2012      Type of Therapy:  Group Therapy   Participation Level:  Did Not Attend    Ambrose Mantle, LCSW 06/19/2012, 4:14 PM

## 2012-06-19 NOTE — Progress Notes (Signed)
Alejandro Murphy remains sad, depressed and flat. Alejandro Murphy pays attention during his groups, is engaged in the group discussions, but remins slightly " aloof" to the real issue, as related to his hospitalization. While Alejandro Murphy is cooperative and very pleasant to deal with , Alejandro Murphy admits Alejandro Murphy has a " vast" anxiety " that's always there..." and Alejandro Murphy always feels somewhat uncomfortable, anticiaptaing when it will reach unmanageable levels for him...    A Alejandro Murphy takes his scheduled meds as they are ordered and Alejandro Murphy attends his groups and Alejandro Murphy shares his feelings, but continues to minimize the seriousness of his feelings, as they were prior to his admission here in this facility.    R Safety is in place and POC includes continuing  to foster therapeutic relationship.

## 2012-06-19 NOTE — Progress Notes (Signed)
Group Topic/Focus:  Gratefulness:  The focus of this group is to help patients identify what two things they are most grateful for in their lives. What helps ground them and to center them on their work to their recovery.  Participation Level:  Active  Participation Quality:  Attentive  Affect:  Appropriate  Cognitive:  Alert  Insight:  Engaged  Engagement in Group:  Engaged  Additional Comments:  Pt stated that he was grateful for his good health. Dione Housekeeper

## 2012-06-20 MED ORDER — HYDROXYZINE HCL 25 MG PO TABS
25.0000 mg | ORAL_TABLET | Freq: Every evening | ORAL | Status: DC | PRN
  Administered 2012-06-20: 25 mg via ORAL

## 2012-06-20 MED ORDER — SERTRALINE HCL 50 MG PO TABS
50.0000 mg | ORAL_TABLET | Freq: Every day | ORAL | Status: DC
  Administered 2012-06-21 – 2012-06-22 (×2): 50 mg via ORAL
  Filled 2012-06-20 (×3): qty 1

## 2012-06-20 NOTE — Progress Notes (Signed)
Patient ID: Alejandro Murphy, male   DOB: [birth year], 22 y.o.   MRN: [id number] D: Pt. Reports denies concerns tonight "actually I'm having a lot of fun", reports groups been helpful found that he has problems with relationship, but handle things well spiritually. Pt. Says he learned from relationships "If you gonna be dumb, got to be tough". A: Writer introduced self to client, provided emotional support by listening and encouraging client to use coping skills learned in relationship issues. Staff will monitor q81min for safety. R: Pt. Is safe on the unit. Pt. Attended groups.

## 2012-06-20 NOTE — Progress Notes (Signed)
Adult Psychoeducational Group Note  Date:  06/20/2012 Time:  1:21 PM  Group Topic/Focus:  Self Care:   The focus of this group is to help patients understand the importance of self-care in order to improve or restore emotional, physical, spiritual, interpersonal, and financial health.  Participation Level:  Active  Participation Quality:  Appropriate and Attentive  Affect:  Appropriate  Cognitive:  Alert and Appropriate  Insight: Appropriate  Engagement in Group:  Engaged  Modes of Intervention:  Discussion  Additional Comments:  Pt was appropriate and sharing while attending group. Pt stated that avoiding negativity is what self-care meant to him. Pt also communicated that staying in contact with faraway friends was something that he did well as it relates to relationship self-care and he wants to improve on allowing others to do things for him.   Sharyn Lull 06/20/2012, 1:21 PM

## 2012-06-20 NOTE — Progress Notes (Signed)
[facility] MD Progress Note  06/20/2012 12:33 PM Alejandro Murphy  MRN:  [id number] Subjective:  Patient reports feeling down. States he does not feel the effects of zoloft yet. He reports his previous hospitalization was when he cut on his wrist and states he was on multiple medications at the time. He is wary about being on multiple medications. Feels he might benefit from a counselor who is faith based. Diagnosis:   Axis I: Major Depression, Recurrent severe Axis II: Deferred Axis III:  Past Medical History  Diagnosis Date  . Depression    Axis IV: educational problems, occupational problems and other psychosocial or environmental problems Axis V: 41-50 serious symptoms  ADL's:  Intact  Sleep: Fair  Appetite:  Fair   Psychiatric Specialty Exam: Review of Systems  Constitutional: Negative.   HENT: Negative.   Eyes: Negative.   Respiratory: Negative.   Cardiovascular: Negative.   Gastrointestinal: Negative.   Genitourinary: Negative.   Musculoskeletal: Negative.   Skin: Negative.   Neurological: Negative.   Endo/Heme/Allergies: Negative.   Psychiatric/Behavioral: Positive for depression. The patient is nervous/anxious.     Blood pressure 133/85, pulse 61, temperature 96.7 F (35.9 C), temperature source Oral, resp. rate 16, height 6' (1.829 m), weight 122.698 kg (270 lb 8 oz).Body mass index is 36.68 kg/(m^2).  General Appearance: Casual  Eye Contact::  Fair  Speech:  Clear and Coherent  Volume:  Decreased  Mood:  Depressed and Dysphoric  Affect:  Congruent  Thought Process:  Coherent  Orientation:  Full (Time, Place, and Person)  Thought Content:  WDL  Suicidal Thoughts:  No  Homicidal Thoughts:  No  Memory:  Immediate;   Fair Recent;   Fair Remote;   Fair  Judgement:  Fair  Insight:  Present  Psychomotor Activity:  Normal  Concentration:  Fair  Recall:  Fair  Akathisia:  No  Handed:  Right  AIMS (if indicated):     Assets:  Communication Skills Desire for  Improvement Housing Social Support  Sleep:  Number of Hours: 6   Current Medications: Current Facility-Administered Medications  Medication Dose Route Frequency Provider Last Rate Last Dose  . acetaminophen (TYLENOL) tablet 650 mg  650 mg Oral Q6H PRN Shuvon Rankin, NP      . alum & mag hydroxide-simeth (MAALOX/MYLANTA) 200-200-20 MG/5ML suspension 30 mL  30 mL Oral Q4H PRN Shuvon Rankin, NP   30 mL at 06/19/12 0652  . hydrOXYzine (ATARAX/VISTARIL) tablet 50 mg  50 mg Oral QHS PRN,MR X 1 Cleotis Nipper, MD   50 mg at 06/19/12 2220  . magnesium hydroxide (MILK OF MAGNESIA) suspension 30 mL  30 mL Oral Daily PRN Shuvon Rankin, NP      . [START ON 06/21/2012] sertraline (ZOLOFT) tablet 50 mg  50 mg Oral Daily [name], MD        Lab Results: No results found for this or any previous visit (from the past 48 hour(s)).  Physical Findings: AIMS: Facial and Oral Movements Muscles of Facial Expression: None, normal Lips and Perioral Area: None, normal Jaw: None, normal Tongue: None, normal,Extremity Movements Upper (arms, wrists, hands, fingers): None, normal Lower (legs, knees, ankles, toes): None, normal, Trunk Movements Neck, shoulders, hips: None, normal, Overall Severity Severity of abnormal movements (highest score from questions above): None, normal Incapacitation due to abnormal movements: None, normal Patient's awareness of abnormal movements (rate only patient's report): No Awareness, Dental Status Current problems with teeth and/or dentures?: No Does patient usually wear dentures?: No  CIWA:    COWS:     Treatment Plan Summary: Daily contact with patient to assess and evaluate symptoms and progress in treatment Medication management  Plan: Increase zoloft to 50mg . Side effects and benefits discussed. Educated about medications and the role they play incombination with therapy. Patient willing to try a faith based counsellor. Plan for discharge as patient  stabilizes.  Medical Decision Making Problem Points:  Established problem, stable/improving (1), Review of last therapy session (1) and Review of psycho-social stressors (1) Data Points:  Review of medication regiment & side effects (2) Review of new medications or change in dosage (2)  I certify that inpatient services furnished can reasonably be expected to improve the patient's condition.   RAVI, HIMABINDU 06/20/2012, 12:33 PM

## 2012-06-20 NOTE — Progress Notes (Signed)
[facility] LCSW Aftercare Discharge Planning Group Note  06/20/2012 3:08 PM  Participation Quality:  Appropriate and Attentive  Affect:  Appropriate and Depressed  Cognitive:  Appropriate  Insight:  Developing/Improving  Engagement in Group:  Developing/Improving  Modes of Intervention:  Exploration, Problem-solving, Rapport Building and Support  Summary of Progress/Problems:  Patient shared he admitted to the hospital due to stress.  He shared he he was feeling HI but not toward anyone in particular.  He currently denies SI/HI and advised that he had not seen anyone outpatient in a year or so.  He plans to return to his parent's home at discharge.  Wynn Banker 06/20/2012, 3:08 PM

## 2012-06-20 NOTE — Progress Notes (Signed)
D:  Alejandro Murphy reports that he slept well and his appetite is good.  His energy level is normal and his ability to pay attention is good.  He is rating hopelessness and depression at 1/10.  He denies SI/HI/AVH at this time.  He is attending groups and is interacting appropriately with staff and others. A:  Medications administered as ordered.  Safety checks q 15 minutes.  Emotional support provided. R:  Safety maintained on unit.

## 2012-06-20 NOTE — Progress Notes (Signed)
Adult Psychoeducational Group Note  Date:  06/20/2012 Time:  2015   Group Topic/Focus:  Wrap-Up Group:   The focus of this group is to help patients review their daily goal of treatment and discuss progress on daily workbooks.  Participation Level:  Active  Participation Quality:  Appropriate  Affect:  Appropriate  Cognitive:  Appropriate  Insight: Improving  Engagement in Group:  Developing/Improving and Engaged  Modes of Intervention:  Discussion and Support  Additional Comments:  Pt. Stated he had an okay day and was able to maintain being positive by keeping his self occupy by engaging in different activity and attending groups  Gwenevere Ghazi Patience 06/20/2012, 10:41 PM

## 2012-06-20 NOTE — Progress Notes (Signed)
[facility] LCSW Group Therapy  06/20/2012 3:10 PM  Type of Therapy:  Group Therapy  Participation Level:  Active  Participation Quality:  Appropriate  Affect:  Appropriate  Cognitive:  Appropriate  Insight:  Developing/Improving  Engagement in Therapy:  Developing/Improving  Modes of Intervention:  Discussion, Exploration, Problem-solving, Rapport Building and Support  Summary of Progress/Problems:  Patient shared he is not certain what his obstacle is.  He advised of admitting to the hospital to have time for clarity to figure out his thoughts.  He shared he does not have a place he can be alone to think at home. Alejandro Murphy 06/20/2012, 3:10 PM

## 2012-06-20 NOTE — [facility] Counselor (Signed)
Adult Comprehensive Assessment  Patient ID: SHANTA DORVIL, male   DOB: May 23, 1990, 22 y.o.   MRN: [id number]  Information Source: Information source: Patient  Current Stressors:  Educational / Learning stressors: None Employment / Job issues: Patient is unemployed Family Relationships: None Surveyor, quantity / Lack of resources (include bankruptcy): Difficulty due to no source of income Housing / Lack of housing: None Physical health (include injuries & life threatening diseases): None Social relationships: None Substance abuse: Drinks alcohol on ocassion Bereavement / Loss: None  Living/Environment/Situation:  Living Arrangements: Parent Living conditions (as described by patient or guardian): Good How long has patient lived in current situation?: All of his life What is atmosphere in current home: Comfortable;Supportive  Family History:  Marital status: Single Does patient have children?: No  Childhood History:  By whom was/is the patient raised?: Both parents Additional childhood history information: Good Description of patient's relationship with caregiver when they were a child: Very good Patient's description of current relationship with people who raised him/her: Very good Does patient have siblings?: No Did patient suffer any verbal/emotional/physical/sexual abuse as a child?: No Did patient suffer from severe childhood neglect?: No Has patient ever been sexually abused/assaulted/raped as an adolescent or adult?: No Was the patient ever a victim of a crime or a disaster?: No Witnessed domestic violence?: No Has patient been effected by domestic violence as an adult?: No  Education:  Highest grade of school patient has completed: 12th Currently a student?: No Learning disability?: No  Employment/Work Situation:   Employment situation: Unemployed Patient's job has been impacted by current illness: No What is the longest time patient has a held a job?: Patient reports he  has never worked Where was the patient employed at that time?: N/A Has patient ever been in the [employer]?: No Has patient ever served in Buyer, retail?: No  Financial Resources:   Surveyor, quantity resources: No income Does patient have a Lawyer or guardian?: No  Alcohol/Substance Abuse:   What has been your use of drugs/alcohol within the last 12 months?: Patient reports he may drink alcohol once a month socially If attempted suicide, did drugs/alcohol play a role in this?: No Alcohol/Substance Abuse Treatment Hx: Denies past history Has alcohol/substance abuse ever caused legal problems?: No  Social Support System:   Forensic psychologist System: None Type of faith/religion: None  How does patient's faith help to cope with current illness?: Patient shared dhe stopped attending church after grandfather died  Leisure/Recreation:   Leisure and Hobbies: Thinker - Meditate  Strengths/Needs:   What things does the patient do well?: Good Cook In what areas does patient struggle / problems for patient: Finding a job  Discharge Plan:   Does patient have access to transportation?: Yes Will patient be returning to same living situation after discharge?: Yes Currently receiving community mental health services: No If no, would patient like referral for services when discharged?: Yes (What county?) [facility] [place]) Does patient have financial barriers related to discharge medications?: No  Summary/Recommendations:   Griffith Santilli is a 22 years old Caucasian male admitted with Major Depression Disorder.  He will benefit from crisis stabilization, evaluation for medication, psycho-education groups for coping skills development, group therapy and case management for discharge planning.     Hodnett, Joesph July. 06/20/2012

## 2012-06-21 NOTE — Progress Notes (Signed)
[facility] LCSW Group Therapy  06/21/2012 3:51 PM  Type of Therapy:  Group Therapy  Participation Level:  Active  Participation Quality:  Appropriate  Affect:  Appropriate  Cognitive:  Appropriate  Insight:  Developing/Improving  Engagement in Therapy:  Developing/Improving  Modes of Intervention:  Discussion, Exploration, Problem-solving, Rapport Building and Support  Summary of Progress/Problems:  Patient shared he has an ongoing battle with accepting his diagnosis and doing what is needed to take care of himself.     Wynn Banker 06/21/2012, 3:51 PM

## 2012-06-21 NOTE — Progress Notes (Signed)
Grief and Loss Group  Patients discussed significant losses and grief in there lives.  Patients explored ways to cope effectively with loss and associated grief emotions.    Pt was present in group and attentive to group members as they shared.  Akers, Whitney Counseling Intern [employer] 

## 2012-06-21 NOTE — Progress Notes (Signed)
Adult Psychoeducational Group Note  Date:  06/21/2012 Time:  1:39 PM  Group Topic/Focus:  Recovery Goals:   The focus of this group is to identify appropriate goals for recovery and establish a plan to achieve them.  Participation Level:  Active  Participation Quality:  Appropriate, Attentive and Sharing  Affect:  Appropriate  Cognitive:  Alert and Appropriate  Insight: Appropriate  Engagement in Group:  Engaged  Modes of Intervention:  Discussion  Additional Comments:  Pt. Was appropriate and attentive while in group discussion. Pt. Was able to share with group about What is Recovery. Pt. Stated that he is willing to work on mental state, being assertive, and engaging in positive social activities. Pt. Shared ways of improving goal by keeping positive people around and staying positive.   Sharyn Lull 06/21/2012, 1:39 PM

## 2012-06-21 NOTE — Progress Notes (Signed)
[facility] LCSW Aftercare Discharge Planning Group Note  06/21/2012 12:14 PM  Participation Quality:  Appropriate and Attentive  Affect:  Appropriate and Depressed  Cognitive:  Appropriate  Insight:  Developing/Improving  Engagement in Group:  Developing/Improving  Modes of Intervention:  Exploration, Problem-solving, Rapport Building and Support  Summary of Progress/Problems:  Patient shared doing okay today.  He denied SI/HI and rates depression and anxiety one.  Patient stated he will be willing to follow up with outpatient services at [facility].     Wynn Banker 06/21/2012, 12:14 PM

## 2012-06-21 NOTE — Progress Notes (Signed)
D:  Alejandro Murphy reports that he slept well and his appetite is good.  He is rating depression and hopelessness at 0/10.  He denies SI/HI/AVH at this time.  He is attending groups and is interacting appropriately with staff and other patients.  A:  Medications administered as ordered.  Emotional support provided.  Safety checks q 15 minutes. R:  Safety maintained on unit.

## 2012-06-21 NOTE — Progress Notes (Signed)
[facility] MD Progress Note  06/21/2012 11:30 AM Alejandro Murphy  MRN:  [id number] Subjective:  Patient reports he is depressed, feels numb. Was able to share his feelings in group today. Tolerating the increase in Zoloft well, denies side effects.    Diagnosis:   Axis I: Major Depression, Recurrent severe Axis II: Deferred Axis III:  Past Medical History  Diagnosis Date  . Depression    Axis IV: occupational problems and other psychosocial or environmental problems Axis V: 41-50 serious symptoms  ADL's:  Intact  Sleep: Fair  Appetite:  Fair   Psychiatric Specialty Exam: Review of Systems  Constitutional: Negative.   HENT: Negative.   Eyes: Negative.   Respiratory: Negative.   Cardiovascular: Negative.   Gastrointestinal: Negative.   Genitourinary: Negative.   Musculoskeletal: Negative.   Skin: Negative.   Neurological: Negative.   Endo/Heme/Allergies: Negative.   Psychiatric/Behavioral: Positive for depression. The patient is nervous/anxious.     Blood pressure 133/84, pulse 83, temperature 97.8 F (36.6 C), temperature source Oral, resp. rate 20, height 6' (1.829 m), weight 122.698 kg (270 lb 8 oz).Body mass index is 36.68 kg/(m^2).  General Appearance: Disheveled  Eye Solicitor::  Fair  Speech:  Clear and Coherent  Volume:  Normal  Mood:  Anxious and Dysphoric  Affect:  Constricted  Thought Process:  Circumstantial  Orientation:  Full (Time, Place, and Person)  Thought Content:  Rumination  Suicidal Thoughts:  No  Homicidal Thoughts:  No  Memory:  Immediate;   Fair Recent;   Fair Remote;   Fair  Judgement:  Fair  Insight:  Present  Psychomotor Activity:  Decreased  Concentration:  Fair  Recall:  Fair  Akathisia:  No  Handed:  Right  AIMS (if indicated):     Assets:  Communication Skills Desire for Improvement Social Support  Sleep:  Number of Hours: 6.75   Current Medications: Current Facility-Administered Medications  Medication Dose Route Frequency  Jaysean Manville Last Rate Last Dose  . acetaminophen (TYLENOL) tablet 650 mg  650 mg Oral Q6H PRN Shuvon Rankin, NP      . alum & mag hydroxide-simeth (MAALOX/MYLANTA) 200-200-20 MG/5ML suspension 30 mL  30 mL Oral Q4H PRN Shuvon Rankin, NP   30 mL at 06/19/12 0652  . hydrOXYzine (ATARAX/VISTARIL) tablet 25 mg  25 mg Oral QHS PRN,MR X 1 Kerry Hough, PA   25 mg at 06/20/12 2235  . magnesium hydroxide (MILK OF MAGNESIA) suspension 30 mL  30 mL Oral Daily PRN Shuvon Rankin, NP      . sertraline (ZOLOFT) tablet 50 mg  50 mg Oral Daily [name], MD   50 mg at 06/21/12 7829    Lab Results: No results found for this or any previous visit (from the past 48 hour(s)).  Physical Findings: AIMS: Facial and Oral Movements Muscles of Facial Expression: None, normal Lips and Perioral Area: None, normal Jaw: None, normal Tongue: None, normal,Extremity Movements Upper (arms, wrists, hands, fingers): None, normal Lower (legs, knees, ankles, toes): None, normal, Trunk Movements Neck, shoulders, hips: None, normal, Overall Severity Severity of abnormal movements (highest score from questions above): None, normal Incapacitation due to abnormal movements: None, normal Patient's awareness of abnormal movements (rate only patient's report): No Awareness, Dental Status Current problems with teeth and/or dentures?: No Does patient usually wear dentures?: No  CIWA:  CIWA-Ar Total: 0 COWS:     Treatment Plan Summary: Daily contact with patient to assess and evaluate symptoms and progress in treatment Medication management  Plan: Continue current plan of care. Plan for discharge once stable.  Medical Decision Making Problem Points:  Established problem, stable/improving (1), Review of last therapy session (1) and Review of psycho-social stressors (1) Data Points:  Review of medication regiment & side effects (2)  I certify that inpatient services furnished can reasonably be expected to improve the  patient's condition.   RAVI, HIMABINDU 06/21/2012, 11:30 AM

## 2012-06-21 NOTE — Progress Notes (Signed)
D: Patient in the dayroom engaged in conversation with a peer on approach.  Patient states , "I feel fine I am just tired."  Patient states the medications he has taken at night has him tired throughout the day.  Patient states he also thinks he has had years of bad sleep habits.  Patient states when he is discharged he is going to apply for school at [employer].  Patient rates depression 5/10.  Patient denies SI/HI and denies AVH.  A: Staff to monitor Q 15 mins for safety.  Encouragement and support offered.  No medications administered tonight R: Patient remains safe on the unit.  Patient attended group tonight.  Patient visible on the unit and interacting with peers.  No medications administered tonight.

## 2012-06-22 DIAGNOSIS — F331 Major depressive disorder, recurrent, moderate: Principal | ICD-10-CM

## 2012-06-22 MED ORDER — HYDROXYZINE HCL 25 MG PO TABS: 25.0000 mg | ORAL_TABLET | Freq: Every evening | ORAL | Status: DC | PRN

## 2012-06-22 MED ORDER — SERTRALINE HCL 50 MG PO TABS: 50.0000 mg | ORAL_TABLET | Freq: Every day | ORAL | Status: DC

## 2012-06-22 NOTE — Progress Notes (Signed)
[facility] INPATIENT:  Family/Significant Other Suicide Prevention Education  Suicide Prevention Education:  Education Completed; Gunnar Fusi, Mother, [facility] Harlan Stains has been identified by the patient as the family member/significant other with whom the patient will be residing, and identified as the person(s) who will aid the patient in the event of a mental health crisis (suicidal ideations/suicide attempt).  With written consent from the patient, the family member/significant other has been provided the following suicide prevention education, prior to the and/or following the discharge of the patient.  The suicide prevention education provided includes the following:  Suicide risk factors  Suicide prevention and interventions  National Suicide Hotline telephone number  [facility] assessment telephone number  [facility] Emergency Assistance 911  [facility] and/or Residential Mobile Crisis Unit telephone number  Request made of family/significant other to:  Remove weapons (e.g., guns, rifles, knives), all items previously/currently identified as safety concern.  Mother reports guns are in a locked safe.  Remove drugs/medications (over-the-counter, prescriptions, illicit drugs), all items previously/currently identified as a safety concern.  The family member/significant other verbalizes understanding of the suicide prevention education information provided.  The family member/significant other agrees to remove the items of safety concern listed above.  Wynn Banker 06/22/2012, 12:25 PM

## 2012-06-22 NOTE — [facility] Suicide Risk Assessment (Signed)
Suicide Risk Assessment  Discharge Assessment     Demographic Factors:  Adolescent or young adult and Unemployed  Mental Status Per Nursing Assessment::   On Admission:  Suicidal ideation indicated by patient;Suicidal ideation indicated by others  Current Mental Status by Physician: In full contact with reality. There are no suicidal ideas, plans or intent. Says that finding himself without a job, and being "cut off ' school got him depressed. He is better now. He feels he has better coping skills now. Will see a counselor   Loss Factors: Decrease in vocational status  Historical Factors: NA  Risk Reduction Factors:   Sense of responsibility to family, Living with another person, especially a relative, Positive social support and Positive coping skills or problem solving skills  Continued Clinical Symptoms: Depression   Cognitive Features That Contribute To Risk: N/A   Suicide Risk:  Minimal: No identifiable suicidal ideation.  Patients presenting with no risk factors but with morbid ruminations; may be classified as minimal risk based on the severity of the depressive symptoms  Discharge Diagnoses:   AXIS I:  Major Depression recurrent AXIS II:  Deferred AXIS III:   Past Medical History  Diagnosis Date  . Depression    AXIS IV:  educational problems and occupational problems AXIS V:  61-70 mild symptoms  Plan Of Care/Follow-up recommendations:  Activity:  as tolerated Diet:  regular Will follow up outpatient basis. Will continue taking Zoloft Is patient on multiple antipsychotic therapies at discharge:  No   Has Patient had three or more failed trials of antipsychotic monotherapy by history:  No  Recommended Plan for Multiple Antipsychotic Therapies: N/A   LUGO,IRVING A 06/22/2012, 11:18 AM

## 2012-06-22 NOTE — Progress Notes (Signed)
Adult Psychoeducational Group Note  Date:  06/22/2012 Time:  4:09 PM  Group Topic/Focus:  Personal Choices and Values:   The focus of this group is to help patients assess and explore the importance of values in their lives, how their values affect their decisions, how they express their values and what opposes their expression.  Participation Level:  Active  Participation Quality:  Appropriate, Attentive and Sharing  Affect:  Appropriate  Cognitive:  Appropriate  Insight: Appropriate and Good  Engagement in Group:  Engaged  Modes of Intervention:  Discussion, Education and Support  Additional Comments: Rivan attended group and participated. Patient shared personal term of values and choices. Patient was asked to expalin the negative and positive choices and values that have been made throughout lifespan, but patient did not comment. Patient then discussed what the outcome of the negative choices effect on patient life. Patient completed form in group on Identifying values and Choosing a value orientating life worksheet, and explained answers within the group.   Karleen Hampshire Brittini 06/22/2012, 4:09 PM
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
## 2012-06-22 NOTE — Progress Notes (Signed)
[facility] Adult Case Management Discharge Plan :  Will you be returning to the same living situation after discharge: Yes,  Patient will return to parents' home. At discharge, do you have transportation home?:Yes,  Patient to arrange his own transportation. Do you have the ability to pay for your medications:Yes,  Patient can afford to get medications.  Release of information consent forms completed and in the chart;  Patient's signature needed at discharge.  Patient to Follow up at: Follow-up Information   Follow up with Florencia Reasons [facility] [place], [place] On 06/27/2012. (You are scheduled with Florencia Reasons on Monday, June 21, 2012 at 3:00.  Please go by the office before appointment to pick up registration package.)    Contact information:   [address] [place], [place]   [address]  [phone]      Follow up with Dr. Dan Humphreys [facility] On 07/15/2012. (You are scheduled with Dr. Dan Humphreys on Friday, July 15, 2012 at 1:00 )    Contact information:   [phone]      Patient denies SI/HI:   Yes,  Patient denies SI/HI or other thoughts of self harm.    Safety Planning and Suicide Prevention discussed:  Yes,  Reviewed during aftercare goup.  Wynn Banker 06/22/2012, 9:43 AM

## 2012-06-22 NOTE — Progress Notes (Signed)
Patient ID: Alejandro Murphy, male   DOB: 07-14-1990, 22 y.o.   MRN: [id number] Patient discharged per physician orders; patient discharged to home with parents who he met in the lobby; patient left the unit ambulatory; patient received his prescriptions and copy of AVS after his medications and followup appointments, and all discharge instructions were reviewed and explained; patient was given the opportunity to voice any questions or concerns and patient had no other questions or concerns at this time; patient signed and verbalized that he received all belongings;

## 2012-06-24 NOTE — Progress Notes (Signed)
Patient Discharge Instructions:  Next Level Care Provider Has Access to the EMR, 06/24/12 Records provided to [facility] via [facility]/Epic access.  Jerelene Redden, 06/24/2012, 2:21 PM

## 2012-06-27 ENCOUNTER — Encounter (HOSPITAL_COMMUNITY): Payer: Self-pay | Admitting: Psychiatry

## 2012-06-27 ENCOUNTER — Ambulatory Visit (INDEPENDENT_AMBULATORY_CARE_PROVIDER_SITE_OTHER): Payer: 59 | Admitting: Psychiatry

## 2012-06-27 DIAGNOSIS — F331 Major depressive disorder, recurrent, moderate: Secondary | ICD-10-CM

## 2012-06-27 NOTE — Discharge Summary (Signed)
Reviewed

## 2012-06-28 NOTE — Patient Instructions (Signed)
Discussed orally 
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
## 2012-07-04 ENCOUNTER — Ambulatory Visit (INDEPENDENT_AMBULATORY_CARE_PROVIDER_SITE_OTHER): Payer: 59 | Admitting: Psychiatry

## 2012-07-04 DIAGNOSIS — F331 Major depressive disorder, recurrent, moderate: Secondary | ICD-10-CM

## 2012-07-04 NOTE — Progress Notes (Signed)
Patient:  Alejandro Murphy   DOB: December 28, 1990  MR Number: [id number]  Location: Behavioral Health Center:  [address] [place],  [place], [address]  Start: Monday 07/04/2012 11:00 AM End: Monday 07/04/2012 11:50 AM  Provider/Observer:     Florencia Reasons, MSW, LCSW   Chief Complaint:      Chief Complaint  Patient presents with  . Depression  . Anxiety    Reason For Service:    Patient is referred for followup services upon his discharge from hospitalization at the behavioral health center in [place] where he was treated for depression, anxiety, and suicidal ideations. Patient reports a long-standing history of depression and anxiety along with 4 psychiatric hospitalizations. He says his last hospitalization was triggered by feelings of failure as he has not accomplished much since being out of high school. 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 He reports becoming distraught and considering pulling out in front of another car. He informed his mother and voluntarily admitted himself to the hospital. Patient reports suffering from depression since middle school. Patient is seen for followup appointment today.    Interventions Strategy:  Supportive therapy  Participation Level:   Active  Participation Quality:  Appropriate      Behavioral Observation:  Casual, Alert, and Appropriate.   Current Psychosocial Factors: The patient continues to worry about being unable to find a job. The patient also worries about his mother who has depression per patient's report.  Content of Session:   Reviewing symptoms, establishing rapport, identifying ways to increase structure and improve routine, identifying ways to improve self-care  Current Status:   The patient reports depression at a 6 or 7 and anxiety and a 6 or 7 on a 10 point scale with one being low and 10 being high. He states still crying sometimes and being cloudy  in his brain. He denies having any suicidal ideations since last session.  Patient Progress:   Fair. Patient reports feeling better since last session. He has resumed driving and reports being safe and cautious. He also reports he has tried to increase physical activity and has gone walking  4 or 5 times since last session. Therapist and patient discuss triggers of depression. Patient identifies inactivity as well as listening to certain kinds of music, and disorganization as some of his triggers. Therapist works with patient to do activity scheduling to increase structure and improve routine. Patient reports good support from his parents but expresses worry about his mother who suffers from depression per patient's report. He is pleased that mother seems to be more active recently. Therapist  with patient to process his feelings. He reports thinking about his future and researching information about applying for college. Patient is interested in pursuing robotics or computers.  Target Goals:   Establishing rapport  Last Reviewed:     Goals Addressed Today:    Establishing rapport  Impression/Diagnosis:   The patient presents with a history of symptoms of depression since middle school. He has had 4 psychiatric hospitalizations with the most recent one occurring in March 2014 due to to depression and suicidal thoughts. His current symptoms include depressed mood, anxiety, loss of interest in activities, irritability, excessive worrying, obsessive thoughts, low self-esteem, guilt, indecisiveness, loss of libido and poor concentration. Diagnoses: Major depressive disorder, recurrent, moderate   Diagnosis:  Axis I: Major depressive disorder, recurrent episode, moderate          Axis  II: Deferred

## 2012-07-04 NOTE — Patient Instructions (Signed)
Discussed orally 

## 2012-07-12 ENCOUNTER — Ambulatory Visit (INDEPENDENT_AMBULATORY_CARE_PROVIDER_SITE_OTHER): Payer: 59 | Admitting: Psychiatry

## 2012-07-12 DIAGNOSIS — F331 Major depressive disorder, recurrent, moderate: Secondary | ICD-10-CM

## 2012-07-12 NOTE — Patient Instructions (Signed)
Discussed orally 

## 2012-07-12 NOTE — Progress Notes (Signed)
Patient:  Alejandro Murphy   DOB: [birth year]  MR Number: [id number]  Location: Behavioral Health Center:  [address] [place],  [place], [address]  Start: Tuesday 07/12/2012 10:00 AM End: Tuesday 07/12/2012 10:50 AM  Provider/Observer:     Florencia Reasons, MSW, LCSW   Chief Complaint:      Chief Complaint  Patient presents with  . Depression  . Anxiety    Reason For Service:    Patient is referred for followup services upon his discharge from hospitalization at the behavioral health center in [place] where he was treated for depression, anxiety, and suicidal ideations. Patient reports a long-standing history of depression and anxiety along with 4 psychiatric hospitalizations. He says his last hospitalization was triggered by feelings of failure as he has not accomplished much since being out of high school. 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 He reports becoming distraught and considering pulling out in front of another car. He informed his mother and voluntarily admitted himself to the hospital. Patient reports suffering from depression since middle school. Patient is seen for followup appointment today.    Interventions Strategy:  Supportive therapy  Participation Level:   Active  Participation Quality:  Appropriate      Behavioral Observation:  Casual, Alert, and Appropriate,  Fairly well-groomed   Current Psychosocial Factors: The patient's paternal great-grandmother is in the hospital due to having a stroke last week.   Content of Session:   Reviewing symptoms, processing feelings, developing treatment plan  Current Status:   The patient reports depression at a 6  and anxiety varying between 4 and 9 on a 10 point scale with one being low and 10 being high. He denies suicidal ideations and homicidal ideations.  Patient Progress:   Fair. Patient reports continuing to feel better since last session. He  has increased involvement in activity and reports cleaning up more at home and doing yard work. He expresses desire for increased social involvement and expresses frustration as he states he does not have a lot of community he year as he does in his home county of [place]. Patient also expresses a desire to have more motivation and confidence regarding accomplishing  life goals. He expresses poor self acceptance and considers himself a failure as he is 22 years old and is not independent. Therapist works with patient to develop a treatment plan.  Therapist encourages patient to continue efforts to improve structure and routine.Patient reports increased anxiety as his paternal great-grandmother had a stroke last week. He reports he and his mother visited his great-grandmother in the hospital this past weekend. He reports less worry about his mother as she seems to be less depressed.  Target Goals:   1. Increase social involvement and social interaction including attending church: 1:1 psychotherapy one time every 1-4 weeks (supportive, CBT)    2. Increase physical activity and exercise: 1 psychotherapy one time every 1-4 weeks (supportive, CBT)    3. Increase self acceptance: 1:1 psychotherapy one time every 1-4 weeks (supportive, CBT)  Last Reviewed:   07/12/2012  Goals Addressed Today:    Goal 2  Impression/Diagnosis:   The patient presents with a history of symptoms of depression since middle school. He has had 4 psychiatric hospitalizations with the most recent one occurring in March 2014 due to to depression and suicidal thoughts. His current symptoms include depressed mood, anxiety, loss of interest in activities, irritability, excessive worrying, obsessive thoughts,  low self-esteem, guilt, indecisiveness, loss of libido and poor concentration. Diagnoses: Major depressive disorder, recurrent, moderate   Diagnosis:  Axis I: Major depressive disorder, recurrent episode, moderate          Axis II:  Deferred

## 2012-07-15 ENCOUNTER — Ambulatory Visit (INDEPENDENT_AMBULATORY_CARE_PROVIDER_SITE_OTHER): Payer: 59 | Admitting: Psychiatry

## 2012-07-15 ENCOUNTER — Encounter (HOSPITAL_COMMUNITY): Payer: Self-pay | Admitting: Psychiatry

## 2012-07-15 VITALS — Wt 274.0 lb

## 2012-07-15 DIAGNOSIS — F331 Major depressive disorder, recurrent, moderate: Secondary | ICD-10-CM

## 2012-07-15 DIAGNOSIS — G969 Disorder of central nervous system, unspecified: Secondary | ICD-10-CM | POA: Insufficient documentation

## 2012-07-15 DIAGNOSIS — F988 Other specified behavioral and emotional disorders with onset usually occurring in childhood and adolescence: Secondary | ICD-10-CM

## 2012-07-15 DIAGNOSIS — F411 Generalized anxiety disorder: Secondary | ICD-10-CM

## 2012-07-15 DIAGNOSIS — F3130 Bipolar disorder, current episode depressed, mild or moderate severity, unspecified: Secondary | ICD-10-CM

## 2012-07-15 MED ORDER — CARBAMAZEPINE ER 200 MG PO TB12: ORAL_TABLET | ORAL | Status: DC
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
## 2012-07-15 NOTE — Patient Instructions (Signed)
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 These 3 things are the foundation of rehabilitating your brain. Staying off all abusable substances including nicotine, caffeine, and refined sugar and avoiding further head injuries are the other important elements in helping you keep your brain working the best it can for you. If memory is a problem then INSTEAD of the fish oil mentioned above, try using Brain Power Basics from [employer].  You can order online or by phone [phone]. It costs $99 for the first month, and $80 monthly thereafter, but that investment in your brain and the recovery of your brain proper functioning would seem worth it.  Call if problems or concerns.

## 2012-07-20 ENCOUNTER — Ambulatory Visit (INDEPENDENT_AMBULATORY_CARE_PROVIDER_SITE_OTHER): Payer: 59 | Admitting: Psychiatry

## 2012-07-20 DIAGNOSIS — F331 Major depressive disorder, recurrent, moderate: Secondary | ICD-10-CM

## 2012-07-20 NOTE — Patient Instructions (Addendum)
Improve sleep hygiene- develop bedtime ritual/routine taking care of personal hygiene  Develop an early morning exercise routine to start the day  Initiate social contact two times per week

## 2012-07-21 NOTE — Progress Notes (Signed)
Patient:  Alejandro Murphy   DOB: 11-16-1990  MR Number: [id number]  Location: Behavioral Health Center:  [address] [place] [place],  [place], [address]  Start: Wednesday 07/20/2012 11:00 AM End: Wednesday 07/20/2012 11:50 AM  Provider/Observer:     Florencia Reasons, MSW, LCSW   Chief Complaint:      Chief Complaint  Patient presents with  . Depression    Reason For Service:    Patient is referred for followup services upon his discharge from hospitalization at the behavioral health center in [place] where he was treated for depression, anxiety, and suicidal ideations. Patient reports a long-standing history of depression and anxiety along with 4 psychiatric hospitalizations. He says his last hospitalization was triggered by feelings of failure as he has not accomplished much since being out of high school. 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 He reports becoming distraught and considering pulling out in front of another car. He informed his mother and voluntarily admitted himself to the hospital. Patient reports suffering from depression since middle school. Patient is seen for followup appointment today.    Interventions Strategy:  Supportive therapy  Participation Level:   Active  Participation Quality:  Appropriate      Behavioral Observation:  Casual, Alert, and Appropriate,   Current Psychosocial Factors:  Content of Session:   Reviewing symptoms, processing feelings, identifying ways to improve sleep hygiene, identifying ways to improve routine and structure, discussing ways to increase social contact  Current Status:   The patient reports depression at a 4 and anxiety 4 on a 10 point scale with one being low and 10 being high. He denies suicidal ideations and homicidal ideations.  Patient Progress:   Fair. Patient states his mind feels clearer and no longer experiencing blockage of thought since a change in  his medication as directed by psychiatrist Dr. Dan Humphreys. He reports improved communication with his father now that he can think more clearly and is able to complete assigned tasks. Patient has been more active regarding household chores. He continues to experience poor sleep hygiene and an erratic sleep pattern. Therapist works with patient to to identify ways to improve sleep hygiene and to improve routine and structure. Therapist and patient also discuss ways to increase social involvement.   Target Goals:   1. Increase social involvement and social interaction including attending church: 1:1 psychotherapy one time every 1-4 weeks (supportive, CBT)    2. Increase physical activity and exercise: 1 psychotherapy one time every 1-4 weeks (supportive, CBT)    3. Increase self acceptance: 1:1 psychotherapy one time every 1-4 weeks (supportive, CBT)  Last Reviewed:   07/12/2012  Goals Addressed Today:    Goals 1 and 2  Impression/Diagnosis:   The patient presents with a history of symptoms of depression since middle school. He has had 4 psychiatric hospitalizations with the most recent one occurring in March 2014 due to to depression and suicidal thoughts. His current symptoms include depressed mood, anxiety, loss of interest in activities, irritability, excessive worrying, obsessive thoughts, low self-esteem, guilt, indecisiveness, loss of libido and poor concentration. Diagnoses: Major depressive disorder, recurrent, moderate   Diagnosis:  Axis I: Major depressive disorder, recurrent episode, moderate          Axis II: Deferred

## 2012-08-03 ENCOUNTER — Ambulatory Visit (INDEPENDENT_AMBULATORY_CARE_PROVIDER_SITE_OTHER): Payer: 59 | Admitting: Psychiatry

## 2012-08-03 DIAGNOSIS — F331 Major depressive disorder, recurrent, moderate: Secondary | ICD-10-CM

## 2012-08-03 NOTE — Patient Instructions (Signed)
Discussed orally 

## 2012-08-03 NOTE — Progress Notes (Signed)
Patient:  Alejandro Murphy   DOB: 07/04/1990  MR Number: [id number]  Location: Behavioral Health Center:  [address] [place] [place],  [place], [address]  Start: Wednesday 08/03/2012 11:00 AM End: Wednesday 08/03/2012 11:50 AM  Provider/Observer:     Florencia Reasons, MSW, LCSW   Chief Complaint:      Chief Complaint  Patient presents with  . Depression    Reason For Service:    Patient is referred for followup services upon his discharge from hospitalization at the behavioral health center in [place] where he was treated for depression, anxiety, and suicidal ideations. Patient reports a long-standing history of depression and anxiety along with 4 psychiatric hospitalizations. He says his last hospitalization was triggered by feelings of failure as he has not accomplished much since being out of high school. 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 He reports becoming distraught and considering pulling out in front of another car. He informed his mother and voluntarily admitted himself to the hospital. Patient reports suffering from depression since middle school. Patient is seen for followup appointment today.    Interventions Strategy:  Supportive therapy  Participation Level:   Active  Participation Quality:  Appropriate      Behavioral Observation:  Casual, Alert, and Appropriate,   Current Psychosocial Factors:  Content of Session:   Reviewing symptoms, processing feelings, identifying ways to improve sleep hygiene, discussing patient's efforts to increase social contact, identifying the effects of patient's past on his current functioning and social interaction  Current Status:   The patient reports improved mood and decreased anxiety.  Patient Progress:   Fair. Patient reports continuing to feel better but continued difficulty in trying to establish routine and structure. He is pleased with has increased efforts  to participate in conversation with the others outside of his family. Patient reports wanting a part-time job and expresses a desire to begin college this fall. He expresses difficulty determining steps to achieve this. Patient shares more information with therapist today about his past and becomes emotional when discussing adolescence. He reports being very angry during that time as his mother was very depressed, his father was busy working, and patient had to assume a lot of responsibility regarding cleaning and yard work. He also expresses frustration and sadness that he was unable to talk with his parents about the changes he was experiencing in managing puberty. He reports talking with friends who gave patient bad advice which he followed resulting in patient participating in inappropriate behavior. He reports receiving a warning from school personnel. Patient reports withdrawing and feeling betrayed and alone. He also reports becoming very angry but beingn unable to express anger in a healthy way. Patient states managing anger by eating and getting fat. Patient continues to have trust issues and anxiety regarding social interactions.  Target Goals:   1. Increase social involvement and social interaction including attending church: 1:1 psychotherapy one time every 1-4 weeks (supportive, CBT)    2. Increase physical activity and exercise: 1 psychotherapy one time every 1-4 weeks (supportive, CBT)    3. Increase self acceptance: 1:1 psychotherapy one time every 1-4 weeks (supportive, CBT)  Last Reviewed:   07/12/2012  Goals Addressed Today:    Goals 1, 2, and 3  Impression/Diagnosis:   The patient presents with a history of symptoms of depression since middle school. He has had 4 psychiatric hospitalizations with the most recent one occurring in March 2014 due  to to depression and suicidal thoughts. His current symptoms include depressed mood, anxiety, loss of interest in activities, irritability,  excessive worrying, obsessive thoughts, low self-esteem, guilt, indecisiveness, loss of libido and poor concentration. Diagnoses: Major depressive disorder, recurrent, moderate   Diagnosis:  Axis I: Major depressive disorder, recurrent episode, moderate          Axis II: Deferred

## 2012-08-12 ENCOUNTER — Ambulatory Visit (HOSPITAL_COMMUNITY): Payer: Self-pay | Admitting: Psychiatry

## 2012-08-12 ENCOUNTER — Ambulatory Visit (INDEPENDENT_AMBULATORY_CARE_PROVIDER_SITE_OTHER): Payer: 59 | Admitting: Psychiatry

## 2012-08-12 ENCOUNTER — Encounter (HOSPITAL_COMMUNITY): Payer: Self-pay | Admitting: Psychiatry

## 2012-08-12 VITALS — BP 123/75 | HR 73 | Ht 71.0 in | Wt 273.0 lb

## 2012-08-12 DIAGNOSIS — F988 Other specified behavioral and emotional disorders with onset usually occurring in childhood and adolescence: Secondary | ICD-10-CM

## 2012-08-12 DIAGNOSIS — F331 Major depressive disorder, recurrent, moderate: Secondary | ICD-10-CM

## 2012-08-12 DIAGNOSIS — F3130 Bipolar disorder, current episode depressed, mild or moderate severity, unspecified: Secondary | ICD-10-CM

## 2012-08-12 DIAGNOSIS — E559 Vitamin D deficiency, unspecified: Secondary | ICD-10-CM

## 2012-08-12 DIAGNOSIS — G969 Disorder of central nervous system, unspecified: Secondary | ICD-10-CM

## 2012-08-12 DIAGNOSIS — F411 Generalized anxiety disorder: Secondary | ICD-10-CM

## 2012-08-12 MED ORDER — CARBAMAZEPINE ER 200 MG PO TB12: ORAL_TABLET | ORAL | Status: DC

## 2012-08-12 NOTE — Patient Instructions (Addendum)
CUT BACK/CUT OUT on sugar and carbohydrates, that means very limited fruits and starchy vegetables and very limited grains, breads  The goal is low GLYCEMIC INDEX.  CUT OUT all wheat, rye, or barley for the GLUTEN in them.  HIGH fat and LOW carbohydrate diet is the KEY.  Eat avocados, eggs, lean meat like grass fed beef and chicken  Nuts and seeds would be good foods as well.   Stevia is an excellent sweetener.  Safe for the brain.   Lowella Grip is also a good safe sweetener, not the baking blend form of Truvia  Almond butter is awesome.  Check out all this on the Internet.  Dr Heber  is on the Internet with some good info about this.   http://www.drperlmutter.com is where that is.  An excellent site for info on this diet is http://paleoleap.com  [employer] makes dark chocolate that is sweetened with Stevia that is safe.  Get labs today.  Take care of yourself.  No one else is standing up to do the job and only you know what you need.   GET SERIOUS about taking care of yourself.  Do the next right thing and that often means doing something to care for yourself along the lines of are you hungry, are you angry, are you lonely, are you tired, are you scared?  HALTS is what that stands for.  Call if problems or concerns.

## 2012-08-12 NOTE — Progress Notes (Addendum)
[facility] [address] Progress Note Alejandro Murphy MRN: [id number] DOB: 1990-06-23 Age: 22 y.o.  Date: 08/12/2012 Start Time: 2:00 PM End Time: 2:23 PM  Chief Complaint: Chief Complaint  Patient presents with  . Depression  . Follow-up  . Medication Refill   Subjective: "It is going really great.  It is so much better. I can talk to people now.  My brain functions well enough to where I can start conversations". Depression 1/10 and Anxiety 1/10, where 0 is none and 10 is the worst. Pain is 0/10.  He has some soreness around his shoulders and back.  Mood fluctuation is 1/10.  Confusion is 2/10 where it was a 10/10 before starting Tegretol.  He used to have to close his eyes to be able to follow what others were saying.  He is getting invited to stuff with friends now.  This is totally awesome.  The patient returns for follow-up appointment.  Pt reports that he is compliant with the psychotropic medications with good benefit and no noticeable side effects.  He has been able to stop caffeine easily, but struggle with stopping the sugar.    Discussed high fat low sugar diet for this.  History of Chief Complaint:   When he first started talking he noted a lisp.  He kept trying to describe cloudy feeling in his head and began noting depression at age 11.  He has noted mental hyperactivity as well as motor hyperactivity.  He was put on several medications and one was Adderall, which made him angry.   He was on another medication for a while and can't remember the name quit working.  He was on a medication that he sprinkled on his food.  It was supposed to help him focus, but he noted no benefit from that.  He got admitted to the hospital because of deepening depression after a disappointment at work.  He had utter frustration about his interview that he though didn't go well.  He had almost overdosed on pills once.  He is not sure that he wanted to die with that, but sees that he might have  had had those thoughts.  His vision developed frames and trails with the overdose.  He vomited up the pills.  He doesn't want to ever repeat that experience.   HPI Review of Systems  Constitutional: Negative.   HENT: Positive for ear pain, congestion, neck stiffness, sinus pressure and tinnitus.   Eyes: Negative.        Sometimes things appear to flash in a different color and sometimes sees purple or orange or red or green where there is nothing of that color.  The associate with the sky and the amount of light.  More when light is very bright.  Gastrointestinal: Positive for diarrhea.  Genitourinary: Negative.   Neurological: Positive for numbness. Negative for dizziness, tremors, seizures, syncope, facial asymmetry, speech difficulty, weakness, light-headedness and headaches.  Psychiatric/Behavioral: Positive for hallucinations, confusion, sleep disturbance, self-injury, dysphoric mood, decreased concentration and agitation. Negative for suicidal ideas and behavioral problems. The patient is nervous/anxious and is hyperactive.        See the note about eyes for the hallucinations info   Physical Exam Vitals: BP 123/75  Pulse 73  Ht 5\' 11"  (1.803 m)  Wt 273 lb (123.832 kg)  BMI 38.09 kg/m2  Depressive Symptoms: depressed mood, feelings of worthlessness/guilt, hopelessness, disturbed sleep,  (Hypo) Manic Symptoms:   Elevated Mood:  Yes Irritable Mood:  Yes Grandiosity:  Yes Distractibility:  Yes Labiality of Mood:  Yes Delusions:  No Hallucinations: No Impulsivity:  Yes Sexually Inappropriate Behavior:  No Financial Extravagance:  No Flight of Ideas:  No  Anxiety Symptoms: Excessive Worry:  Yes Panic Symptoms:  No Agoraphobia:  No Obsessive Compulsive: No Specific Phobias:  Yes Social Anxiety:  No  Psychotic Symptoms:  None  PTSD Symptoms: Ever had a traumatic exposure:  Yes Had a traumatic exposure in the last month:  No Re-experiencing: Yes gets triggered when  exposed to the smell of chlorine Hypervigilance:  Yes Hyperarousal: Yes Increased Startle Response Avoidance: Yes Decreased Interest/Participation  Traumatic Brain Injury: Yes Blunt Trauma  Past Psychiatric History: Diagnosis: ADHD-IT, Depression  Hospitalizations: 5 all at [facility]  Outpatient Care: here  Substance Abuse Care: none  Self-Mutilation: cut on arms  Suicidal Attempts: perhaps overdose  Violent Behaviors: only at school where he was being bullied   Past Medical History:   Past Medical History  Diagnosis Date  . Depression   . ADHD (attention deficit hyperactivity disorder)    History of Loss of Consciousness:  No Seizure History:  No Cardiac History:  No Allergies:  No Known Allergies Current Medications:  Current Outpatient Prescriptions  Medication Sig Dispense Refill  . carbamazepine (TEGRETOL-XR) 200 MG 12 hr tablet Take by mouth 1 at bed for the first night or 2, then 2 at night for another couple of night, then 3 at night.  90 tablet  1   No current facility-administered medications for this visit.    Previous Psychotropic Medications:  Medication Dose   Adderall     Some kind of sprinkle meds    Substance Abuse History in the last 12 months: Substance Age of 1st Use Last Use Amount Specific Type  Nicotine  14 or 15  years      Alcohol  14  1 month ago      Cannabis  17  1 year ago      Opiates  [age]      Cocaine  none        Methamphetamines  none        LSD  none        Ecstasy  none         Benzodiazepines  none        Caffeine  childhood  yesterday      Inhalants  none        Others:       sugar  childhood  yesterday     Medical Consequences of Substance Abuse: none Legal Consequences of Substance Abuse: none Family Consequences of Substance Abuse: none  Social History: Current Place of Residence: [address] 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 Legal History: none Hobbies/Interests: participating and watching sports  Family History:   Family History  Problem Relation Age of Onset  . Anxiety disorder Mother   . Alcohol abuse Father   . Dementia Paternal Grandmother     PGGM at age 48  . Seizures Paternal Grandmother     PGGM  . ADD / ADHD Neg Hx   . Bipolar disorder Neg Hx   . Depression Neg Hx   . Drug  abuse Neg Hx   . OCD Neg Hx   . Paranoid behavior Neg Hx   . Schizophrenia Neg Hx    Mental Status Examination/Evaluation: Objective:  Appearance: Casual  Eye Contact::  Good  Speech:  Clear and Coherent  Volume:  Normal  Mood:  shady  Affect:  Congruent  Thought Process:  Coherent, Intact and Logical  Orientation:  Full (Time, Place, and Person)  Thought Content:  WDL  Suicidal Thoughts:  No  Homicidal Thoughts:  No  Judgement:  Fair  Insight:  Fair  Psychomotor Activity:  Normal  Akathisia:  No  Handed:  Right  AIMS (if indicated):    Assets:  Communication Skills Desire for Improvement   Lab Results:  Results for orders placed during the hospital encounter of 06/17/12 (from the past 2016 hour(s))  URINE RAPID DRUG SCREEN (HOSP PERFORMED)   Collection Time    06/17/12  6:23 PM      Result Value Range   Opiates NONE DETECTED  NONE DETECTED   Cocaine NONE DETECTED  NONE DETECTED   Benzodiazepines NONE DETECTED  NONE DETECTED   Amphetamines NONE DETECTED  NONE DETECTED   Tetrahydrocannabinol NONE DETECTED  NONE DETECTED   Barbiturates NONE DETECTED  NONE DETECTED  URINALYSIS, ROUTINE W REFLEX MICROSCOPIC   Collection Time    06/17/12  6:24 PM      Result Value Range   Color, Urine YELLOW  YELLOW   APPearance CLOUDY (*) CLEAR    Specific Gravity, Urine 1.027  1.005 - 1.030   pH 5.5  5.0 - 8.0   Glucose, UA NEGATIVE  NEGATIVE mg/dL   Hgb urine dipstick NEGATIVE  NEGATIVE   Bilirubin Urine NEGATIVE  NEGATIVE   Ketones, ur NEGATIVE  NEGATIVE mg/dL   Protein, ur NEGATIVE  NEGATIVE mg/dL   Urobilinogen, UA 0.2  0.0 - 1.0 mg/dL   Nitrite NEGATIVE  NEGATIVE   Leukocytes, UA NEGATIVE  NEGATIVE  CBC   Collection Time    06/18/12  6:40 AM      Result Value Range   WBC 7.7  4.0 - 10.5 K/uL   RBC 5.50  4.22 - 5.81 MIL/uL   Hemoglobin 15.1  13.0 - 17.0 g/dL   HCT 45.4  09.8 - 11.9 %   MCV 80.0  78.0 - 100.0 fL   MCH 27.5  26.0 - 34.0 pg   MCHC 34.3  30.0 - 36.0 g/dL   RDW 14.7  82.9 - 56.2 %   Platelets 220  150 - 400 K/uL  COMPREHENSIVE METABOLIC PANEL   Collection Time    06/18/12  6:40 AM      Result Value Range   Sodium 138  135 - 145 mEq/L   Potassium 3.8  3.5 - 5.1 mEq/L   Chloride 103  96 - 112 mEq/L   CO2 25  19 - 32 mEq/L   Glucose, Bld 95  70 - 99 mg/dL   BUN 12  6 - 23 mg/dL   Creatinine, Ser 1.30  0.50 - 1.35 mg/dL   Calcium 9.3  8.4 - 86.5 mg/dL   Total Protein 7.4  6.0 - 8.3 g/dL   Albumin 3.8  3.5 - 5.2 g/dL   AST 28  0 - 37 U/L   ALT 28  0 - 53 U/L   Alkaline Phosphatase 108  39 - 117 U/L   Total Bilirubin 0.4  0.3 - 1.2 mg/dL   GFR calc non Af Amer >90  >90  mL/min   GFR calc Af Amer >90  >90 mL/min  ETHANOL   Collection Time    06/18/12  6:40 AM      Result Value Range   Alcohol, Ethyl (B) <11  0 - 11 mg/dL  LIPID PANEL   Collection Time    06/18/12  6:40 AM      Result Value Range   Cholesterol 177  0 - 200 mg/dL   Triglycerides 409 (*) <150 mg/dL   HDL 27 (*) >81 mg/dL   Total CHOL/HDL Ratio 6.6     VLDL 43 (*) 0 - 40 mg/dL   LDL Cholesterol 191 (*) 0 - 99 mg/dL  MAGNESIUM   Collection Time    06/18/12  6:40 AM      Result Value Range   Magnesium 2.0  1.5 - 2.5 mg/dL  TSH   Collection Time    06/18/12  6:40 AM      Result Value Range   TSH 0.899  0.350 - 4.500 uIU/mL    Will get Tegretol level and Vitamin D level soon.  Assessment:    AXIS I ADHD, inattentive type, Bipolar, Depressed and Generalized Anxiety Disorder  AXIS II Deferred  AXIS III Past Medical History  Diagnosis Date  . Depression   . ADHD (attention deficit hyperactivity disorder)      AXIS IV other psychosocial or environmental problems  AXIS V 51-60 moderate symptoms   Treatment Plan/Recommendations:  Laboratory:  Vitamin D  Psychotherapy: supportive  Medications: Tegretol  Routine PRN Medications:  No  Consultations: none  Safety Concerns:  none  Other:     Plan/Discussion: I took his vitals.  I reviewed CC, tobacco/med/surg Hx, meds effects/ side effects, problem list, therapies and responses as well as current situation/symptoms discussed options. Continue current effective medications. Get labs See orders and pt instructions for more details.  Medical Decision Making Problem Points:  Established problem, stable/improving (1), Review of last therapy session (1) and Review of psycho-social stressors (1) Data Points:  Review or order clinical lab tests (1) Review of medication regiment & side effects (2)  I certify that outpatient services furnished can reasonably be expected to improve the patient's condition.   Orson Aloe, MD, [facility]  Addendum:  08/16/2012 Spoke with pt by phone and informed him that the [facility] level was in the therapeutic range and the the Vitamin D level was in a good range too.  Pt inquired about getting more Tegretol and I informed him that he had a script and 1 refill at the pharmacy Orson Aloe, MD, [facility]

## 2012-08-13 LAB — CARBAMAZEPINE LEVEL, TOTAL: Carbamazepine Lvl: 9.8 ug/mL (ref 4.0–12.0)

## 2012-08-16 LAB — VITAMIN D 1,25 DIHYDROXY
Vitamin D 1, 25 (OH)2 Total: 59 pg/mL (ref 18–72)
Vitamin D2 1, 25 (OH)2: <8 pg/mL
Vitamin D3 1, 25 (OH)2: 59 pg/mL

## 2012-08-19 ENCOUNTER — Ambulatory Visit (INDEPENDENT_AMBULATORY_CARE_PROVIDER_SITE_OTHER): Payer: 59 | Admitting: Psychiatry

## 2012-08-19 DIAGNOSIS — F331 Major depressive disorder, recurrent, moderate: Secondary | ICD-10-CM

## 2012-08-19 NOTE — Patient Instructions (Signed)
Discussed orally 

## 2012-08-19 NOTE — Progress Notes (Signed)
Patient:  Alejandro Murphy   DOB: [birth year]  MR Number: [id number]  Location: Behavioral Health Center:  [address] [place],  [place], [address]  Start: Friday 08/19/2012 10:00 AM End: Friday 08/19/2012 10:50 AM  Provider/Observer:     Florencia Reasons, MSW, LCSW   Chief Complaint:      Chief Complaint  Patient presents with  . Depression    Reason For Service:    Patient is referred for followup services upon his discharge from hospitalization at the behavioral health center in [place] where he was treated for depression, anxiety, and suicidal ideations. Patient reports a long-standing history of depression and anxiety along with 4 psychiatric hospitalizations. He says his last hospitalization was triggered by feelings of failure as he has not accomplished much since being out of high school. 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 He reports becoming distraught and considering pulling out in front of another car. He informed his mother and voluntarily admitted himself to the hospital. Patient reports suffering from depression since middle school. Patient is seen for followup appointment today.    Interventions Strategy:  Supportive therapy  Participation Level:   Active  Participation Quality:  Appropriate      Behavioral Observation:  Casual, Alert, and Appropriate, talkative  Current Psychosocial Factors:  Content of Session:   Reviewing symptoms, processing feelings, identifying sources of anger, identifying effects on social skills and interaction, exploring thought patterns  Current Status:   The patient reports improved mood and decreased anxiety but experiencing increased depressed mood a few days ago. He denies suicidal ideations   Patient Progress:   Fair. Patient reports  recently suffering increased depressed mood which triggered by painful memories of his past relationships. Patient reports  feeling guilty about a pass relationship with a male who basically ignored patient didn't want him to become involved with him when she became pregnant by another person. Therapist works with patient to process his feelings and to begin to explore thought patterns and identify cognitive distortions as well as boundary issues. Therapist works with patient to identify rational statements about his decision in that relationship. He also shares information about her relationship with another male home patient was deeply hurt. Therapist works with patient to identify and verbalize his feelings. Patient admits unresolved anger and misplacing blame regarding his choices. Patient made statement today that he has to learn to love himself. Patient reports increased physical activity reporting trying to walk daily.     Target Goals:   1. Increase social involvement and social interaction including attending church: 1:1 psychotherapy one time every 1-4 weeks (supportive, CBT)    2. Increase physical activity and exercise: 1 psychotherapy one time every 1-4 weeks (supportive, CBT)    3. Increase self acceptance: 1:1 psychotherapy one time every 1-4 weeks (supportive, CBT)  Last Reviewed:   07/12/2012  Goals Addressed Today:    Goals 1, 2, and 3  Impression/Diagnosis:   The patient presents with a history of symptoms of depression since middle school. He has had 4 psychiatric hospitalizations with the most recent one occurring in March 2014 due to to depression and suicidal thoughts. His current symptoms include depressed mood, anxiety, loss of interest in activities, irritability, excessive worrying, obsessive thoughts, low self-esteem, guilt, indecisiveness, loss of libido and poor concentration. Diagnoses: Major depressive disorder, recurrent, moderate   Diagnosis:  Axis I: Major depressive disorder, recurrent episode, moderate  Axis II: Deferred

## 2012-09-02 ENCOUNTER — Ambulatory Visit (HOSPITAL_COMMUNITY): Payer: Self-pay | Admitting: Psychiatry

## 2012-09-21 ENCOUNTER — Ambulatory Visit (INDEPENDENT_AMBULATORY_CARE_PROVIDER_SITE_OTHER): Payer: 59 | Admitting: Psychiatry

## 2012-09-21 DIAGNOSIS — F331 Major depressive disorder, recurrent, moderate: Secondary | ICD-10-CM

## 2012-09-22 NOTE — Progress Notes (Addendum)
Patient:  Alejandro Murphy   DOB: 1990/10/22  MR Number: [id number]  Location: Behavioral [facility] Center:  [address] [place] [place],  [place], [address]  Start: Wednesday 09/21/2012 1:05 PM End: Wednesday 09/21/2012 1:55 PM  Provider/Observer:     Florencia Reasons, MSW, LCSW   Chief Complaint:      Chief Complaint  Patient presents with  . Depression  . Anxiety    Reason For Service:    Patient is referred for followup services upon his discharge from hospitalization at the behavioral health center in [place] where he was treated for depression, anxiety, and suicidal ideations. Patient reports a long-standing history of depression and anxiety along with 4 psychiatric hospitalizations. He says his last hospitalization was triggered by feelings of failure as he has not accomplished much since being out of high school. 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 He reports becoming distraught and considering pulling out in front of another car. He informed his mother and voluntarily admitted himself to the hospital. Patient reports suffering from depression since middle school. Patient is seen for followup appointment today.    Interventions Strategy:  Supportive therapy  Participation Level:   Active  Participation Quality:  Appropriate      Behavioral Observation:  Casual, Alert, and tearful  Current Psychosocial Factors: Patient reports pressure from parents to obtain a job.  Content of Session:   Reviewing symptoms, processing feelings, identifying sources of anger, identifying effects on social skills and interaction, exploring thought patterns  Current Status:   The patient reports improved mood and decreased anxiety but experiencing increased anxiety for a couple of days after patient talked with him about getting a job. He denies suicidal ideations and homicidal ideations. However, he reports having violent intrusive  thoughts. He has been experiencing this for a long time but has been afraid to disclose. Thoughts are unwanted and patient states feeling like a bad person due to having these thoughts.   Patient Progress:   Fair. Patient reports experiencing increased anxiety recently as he feels pressure from parents to obtain a job. He fears that he is not ready for a job as he becomes angry almost daily for no apparent reason per his report. He states having anger outbursts shouting and cussing. He reports difficulty expressing concerns to parents. He discloses today that he has violent intrusive thoughts. As a result, he reports poor self-esteem, sometimes thinking he is evil, and avoiding people. Therapist and patient agreed to include parents in next session to facilitate improved communication and support for patient. Patient reports increased social interaction and is visiting his friends regularly on Saturdays.     Target Goals:   1. Increase social involvement and social interaction including attending church: 1:1 psychotherapy one time every 1-4 weeks (supportive, CBT)    2. Increase physical activity and exercise: 1 psychotherapy one time every 1-4 weeks (supportive, CBT)    3. Increase self acceptance: 1:1 psychotherapy one time every 1-4 weeks (supportive, CBT)  Last Reviewed:   07/12/2012  Goals Addressed Today:    Goals 1 and 3  Impression/Diagnosis:   The patient presents with a history of symptoms of depression since middle school. He has had 4 psychiatric hospitalizations with the most recent one occurring in March 2014 due to to depression and suicidal thoughts. His current symptoms include depressed mood, anxiety, loss of interest in activities, irritability, excessive worrying, obsessive thoughts, low self-esteem, guilt, indecisiveness, loss  of libido and poor concentration. Diagnoses: Major depressive disorder, recurrent, moderate, rule out OCD   Diagnosis:  Axis I: Major depressive disorder,  recurrent episode, moderate          Axis II: Deferred

## 2012-09-22 NOTE — Patient Instructions (Signed)
Discussed orally 

## 2012-09-30 ENCOUNTER — Ambulatory Visit (HOSPITAL_COMMUNITY): Payer: Self-pay | Admitting: Psychiatry

## 2012-09-30 NOTE — Progress Notes (Signed)
t   HPI Review of Systems Physical Exam

## 2012-10-12 ENCOUNTER — Ambulatory Visit (INDEPENDENT_AMBULATORY_CARE_PROVIDER_SITE_OTHER): Payer: 59 | Admitting: Psychiatry

## 2012-10-12 ENCOUNTER — Encounter (HOSPITAL_COMMUNITY): Payer: Self-pay | Admitting: Psychiatry

## 2012-10-12 VITALS — BP 132/84 | Ht 71.0 in | Wt 277.4 lb

## 2012-10-12 DIAGNOSIS — F411 Generalized anxiety disorder: Secondary | ICD-10-CM

## 2012-10-12 DIAGNOSIS — G969 Disorder of central nervous system, unspecified: Secondary | ICD-10-CM

## 2012-10-12 DIAGNOSIS — F3130 Bipolar disorder, current episode depressed, mild or moderate severity, unspecified: Secondary | ICD-10-CM

## 2012-10-12 DIAGNOSIS — F429 Obsessive-compulsive disorder, unspecified: Secondary | ICD-10-CM

## 2012-10-12 DIAGNOSIS — F988 Other specified behavioral and emotional disorders with onset usually occurring in childhood and adolescence: Secondary | ICD-10-CM

## 2012-10-12 DIAGNOSIS — F331 Major depressive disorder, recurrent, moderate: Secondary | ICD-10-CM

## 2012-10-12 MED ORDER — CARBAMAZEPINE ER 200 MG PO TB12: ORAL_TABLET | ORAL | Status: DC

## 2012-10-12 MED ORDER — FLUOXETINE HCL 10 MG PO TABS: ORAL_TABLET | ORAL | Status: DC

## 2012-10-12 NOTE — Progress Notes (Signed)
[facility] [address] Progress Note Alejandro Murphy MRN: [id number] DOB: Jul 28, 1990 Age: 22 y.o.  Date: 10/12/2012 Start Time: 9:15 AM End Time: 9:40 AM  Chief Complaint: Chief Complaint  Patient presents with  . Depression  . Follow-up  . Medication Refill   Subjective: "I am able to do more like chores around the house and I don't have to close my eyes to hear what others are saying and I can keep thoughts in my head long enough to think them through and I can multitask where I was not able to do any of this before". Depression 5/10 and Anxiety 3/10, where 0 is none and 10 is the worst. Pain is 0/10. Confusion is 2/10 where it was a 10/10 before starting Tegretol.    The patient returns for follow-up appointment.  Pt reports that he is compliant with the psychotropic medications with good benefit and no noticeable side effects.  He still has depression and wants to help that some.  He had been on several in the past and didn't see any difference.  Dioscussed using Effexor, Wellbutrin, and Prozac.  We chose to not have more seizures and therefore avoided Wellbutrin,  Chose to try and help the OCD better with the Prozac. Labs look good. Pt informed.  NEW PROBLEM: OCD  LIFE STYLE CHANGE: He is able to reduce his sugar and caffeine some.  History of Chief Complaint:   When he first started talking he noted a lisp.  He kept trying to describe cloudy feeling in his head and began noting depression at age 80.  He has noted mental hyperactivity as well as motor hyperactivity.  He was put on several medications and one was Adderall, which made him angry.   He was on another medication for a while and can't remember the name quit working.  He was on a medication that he sprinkled on his food.  It was supposed to help him focus, but he noted no benefit from that.  He got admitted to the hospital because of deepening depression after a disappointment at work.  He had utter frustration about  his interview that he though didn't go well.  He had almost overdosed on pills once.  He is not sure that he wanted to die with that, but sees that he might have had had those thoughts.  His vision developed frames and trails with the overdose.  He vomited up the pills.  He doesn't want to ever repeat that experience.   HPI Review of Systems  Constitutional: Negative.   HENT: Positive for ear pain, congestion, neck stiffness, sinus pressure and tinnitus.   Eyes: Negative.        Sometimes things appear to flash in a different color and sometimes sees purple or orange or red or green where there is nothing of that color.  The associate with the sky and the amount of light.  More when light is very bright.  Gastrointestinal: Positive for diarrhea.  Genitourinary: Negative.   Neurological: Positive for numbness. Negative for dizziness, tremors, seizures, syncope, facial asymmetry, speech difficulty, weakness, light-headedness and headaches.  Psychiatric/Behavioral: Positive for hallucinations, confusion, sleep disturbance, self-injury, dysphoric mood, decreased concentration and agitation. Negative for suicidal ideas and behavioral problems. The patient is nervous/anxious and is hyperactive.        See the note about eyes for the hallucinations info   Physical Exam Vitals: BP 132/84  Ht 5\' 11"  (1.803 m)  Wt 277 lb 6.4 oz (125.828 kg)  BMI 38.71 kg/m2  Depressive Symptoms: depressed mood, feelings of worthlessness/guilt, hopelessness, disturbed sleep,  (Hypo) Manic Symptoms:   Elevated Mood:  Yes Irritable Mood:  Yes Grandiosity:  Yes Distractibility:  Yes Labiality of Mood:  Yes Delusions:  No Hallucinations: No Impulsivity:  Yes Sexually Inappropriate Behavior:  No Financial Extravagance:  No Flight of Ideas:  No  Anxiety Symptoms: Excessive Worry:  Yes Panic Symptoms:  No Agoraphobia:  No Obsessive Compulsive: No Specific Phobias:  Yes Social Anxiety:  No  Psychotic  Symptoms:  None  PTSD Symptoms: Ever had a traumatic exposure:  Yes Had a traumatic exposure in the last month:  No Re-experiencing: Yes gets triggered when exposed to the smell of chlorine Hypervigilance:  Yes Hyperarousal: Yes Increased Startle Response Avoidance: Yes Decreased Interest/Participation  Traumatic Brain Injury: Yes Blunt Trauma History of Loss of Consciousness:  No Seizure History:  No Cardiac History:  No  Past Psychiatric History: Diagnosis: ADHD-IT, Depression  Hospitalizations: 5 all at [facility]  Outpatient Care: here  Substance Abuse Care: none  Self-Mutilation: cut on arms  Suicidal Attempts: perhaps overdose  Violent Behaviors: only at school where he was being bullied   Allergies: No Known Allergies Medical History: Past Medical History  Diagnosis Date  . Depression   . ADHD (attention deficit hyperactivity disorder)    Surgical History: Past Surgical History  Procedure Laterality Date  . Cholecystectomy  Per pt in 2012  . Wisdom tooth extraction Bilateral    Family History: family history includes Alcohol abuse in his father; Anxiety disorder in his mother; Dementia in his paternal grandmother; and Seizures in his paternal grandmother.  There is no history of ADD / ADHD, and Bipolar disorder, and Depression, and Drug abuse, and OCD, and Paranoid behavior, and Schizophrenia, . Reviewed and nothing is different today.  Current Medications:  Current Outpatient Prescriptions  Medication Sig Dispense Refill  . carbamazepine (TEGRETOL-XR) 200 MG 12 hr tablet Take by mouth 3 at night.  90 tablet  1   No current facility-administered medications for this visit.    Previous Psychotropic Medications: Medication Dose   Adderall     Some kind of sprinkle meds    Substance Abuse History in the last 12 months: Substance Age of 1st Use Last Use Amount Specific Type  Nicotine  14 or 15  years      Alcohol  14  1 month ago      Cannabis  17  1 year ago       Opiates  [age]      Cocaine  none        Methamphetamines  none        LSD  none        Ecstasy  none         Benzodiazepines  none        Caffeine  childhood  yesterday      Inhalants  none        Others:       sugar  childhood  yesterday    Medical Consequences of Substance Abuse: none Legal Consequences of Substance Abuse: none Family Consequences of Substance Abuse: none  Social History: Current Place of Residence: [address] [place] [address] Place of Birth: [place], [place] Family Members: with parents Marital Status:  Single Children: 0  Sons: 0  Daughters: 0 Relationships: none at present Education:  HS Print production planner Problems/Performance: focus Religious Beliefs/Practices: none History of Abuse: emotional (the system)  Occupational Experiences; sheet metal work Hotel manager History:  None. Legal History: none Hobbies/Interests: participating and watching sports  Mental Status Examination/Evaluation: Objective:  Appearance: Casual  Eye Contact::  Good  Speech:  Clear and Coherent  Volume:  Normal  Mood:  shady  Affect:  Congruent  Thought Process:  Coherent, Intact and Logical  Orientation:  Full (Time, Place, and Person)  Thought Content:  WDL  Suicidal Thoughts:  No  Homicidal Thoughts:  No  Judgement:  Fair  Insight:  Fair  Psychomotor Activity:  Normal  Akathisia:  No  Handed:  Right  AIMS (if indicated):    Assets:  Communication Skills Desire for Improvement   Lab Results:  Results for orders placed in visit on 08/12/12 (from the past 2016 hour(s))  CARBAMAZEPINE LEVEL, TOTAL   Collection Time    08/12/12  2:17 PM      Result Value Range   Carbamazepine Lvl 9.8  4.0 - 12.0 ug/mL  VITAMIN D 1,25 DIHYDROXY   Collection Time    08/12/12  2:17 PM      Result Value Range   Vitamin D 1, 25 (OH) Total 59  18 - 72 pg/mL   Vitamin D3 1, 25 (OH) 59     Vitamin D2 1, 25 (OH) <8     Will get Tegretol level and Vitamin D level  soon.  Assessment:   AXIS I ADHD, inattentive type, Bipolar, Depressed and Generalized Anxiety Disorder  AXIS II Deferred  AXIS III Past Medical History  Diagnosis Date  . Depression   . ADHD (attention deficit hyperactivity disorder)      AXIS IV other psychosocial or environmental problems  AXIS V 51-60 moderate symptoms   Treatment Plan/Recommendations: Laboratory:   Lab Results:  Results for orders placed in visit on 08/12/12 (from the past 2016 hour(s))  CARBAMAZEPINE LEVEL, TOTAL   Collection Time    08/12/12  2:17 PM      Result Value Range   Carbamazepine Lvl 9.8  4.0 - 12.0 ug/mL  VITAMIN D 1,25 DIHYDROXY   Collection Time    08/12/12  2:17 PM      Result Value Range   Vitamin D 1, 25 (OH) Total 59  18 - 72 pg/mL   Vitamin D3 1, 25 (OH) 59     Vitamin D2 1, 25 (OH) <8      Psychotherapy: supportive  Medications: Tegretol  Routine PRN Medications:  No  Consultations: none  Safety Concerns:  none  Other:     Plan/Discussion: I took his vitals.  I reviewed CC, tobacco/med/surg Hx, meds effects/ side effects, problem list, therapies and responses as well as current situation/symptoms discussed options. Continue current effective medications. Get labs See orders and pt instructions for more details.  Meds ordered this encounter  Medications  . carbamazepine (TEGRETOL-XR) 200 MG 12 hr tablet    Sig: Take by mouth 3 at night.    Dispense:  90 tablet    Refill:  2  . FLUoxetine (PROZAC) 10 MG tablet    Sig: Take by mouth 1/2 tab for a wk or so, then 1 for another wk or so then could increase to 1.5 tabs    Dispense:  45 tablet    Refill:  3   Medical Decision Making Problem Points:  Established problem, stable/improving (1), New problem, with no additional work-up planned (3), Review of last therapy session (1) and Review of psycho-social stressors (1) Data Points:  Review or order clinical  lab tests (1) Review of medication regiment & side effects  (2) Review of new medications or change in dosage (2)  I certify that outpatient services furnished can reasonably be expected to improve the patient's condition.   Orson Aloe, MD, [facility]

## 2012-10-12 NOTE — Patient Instructions (Signed)
Keep up the healing.  Get into some creative pursuits and make something that your soul wants to make.  Call if problems or concerns.

## 2012-10-20 ENCOUNTER — Ambulatory Visit (INDEPENDENT_AMBULATORY_CARE_PROVIDER_SITE_OTHER): Payer: 59 | Admitting: Psychiatry

## 2012-10-20 DIAGNOSIS — F331 Major depressive disorder, recurrent, moderate: Secondary | ICD-10-CM

## 2012-10-20 NOTE — Patient Instructions (Signed)
Complete plan your day handouts  Complete symptom rating forms

## 2012-10-20 NOTE — Progress Notes (Signed)
   Patient:  Alejandro Murphy   DOB: 1990-11-10  MR Number: [id number]  Location: Behavioral Health Center:  [address] [place],  [place], [address]  Start: Thursday 10/20/2012 1:05 PM End: Thursday 10/20/2012 1:55 PM  Provider/Observer:     Florencia Reasons, MSW, LCSW   Chief Complaint:      Chief Complaint  Patient presents with  . Depression  . Anxiety    Reason For Service:    Patient is referred for followup services upon his discharge from hospitalization at the behavioral health center in [place] where he was treated for depression, anxiety, and suicidal ideations. Patient reports a long-standing history of depression and anxiety along with 4 psychiatric hospitalizations. He says his last hospitalization was triggered by feelings of failure as he has not accomplished much since being out of high school. 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 He reports becoming distraught and considering pulling out in front of another car. He informed his mother and voluntarily admitted himself to the hospital. Patient reports suffering from depression since middle school. Patient is seen for followup appointment today.    Interventions Strategy:  Supportive therapy  Participation Level:   Active  Participation Quality:  Appropriate      Behavioral Observation:  Casual, Alert,  talkative  Current Psychosocial Factors:   Content of Session:   Reviewing symptoms, processing feelings, identifying sources of anger, identifying ways to improve self-care and maintain consistency  Current Status:   The patient reports improved mood and decreased anxiety . He denies suicidal ideations, homicidal ideations, and hallucinations  Patient Progress:   .Patient reports decreased stress as parents are no longer pressuring him to find a job. He states they are supportive and understanding that he is doing the best he can. Patient was  to focus more on enrolling in school. Therapist and patient discuss possible steps and timeline. Patient continues to have erratic pattern regarding activity and self-care efforts. Therapist works with patient to identify ways to improve structure and daily routine. Patient also agrees to complete symptom rating forms for next session. Patient is excited about visiting his friend in [place] this weekend.    Target Goals:   1. Increase social involvement and social interaction including attending church: 1:1 psychotherapy one time every 1-4 weeks (supportive, CBT)    2. Increase physical activity and exercise: 1 psychotherapy one time every 1-4 weeks (supportive, CBT)    3. Increase self acceptance: 1:1 psychotherapy one time every 1-4 weeks (supportive, CBT)  Last Reviewed:   07/12/2012  Goals Addressed Today:    Goals 1 and 2  Impression/Diagnosis:   The patient presents with a history of symptoms of depression since middle school. He has had 4 psychiatric hospitalizations with the most recent one occurring in March 2014 due to to depression and suicidal thoughts. His current symptoms include depressed mood, anxiety, loss of interest in activities, irritability, excessive worrying, obsessive thoughts, low self-esteem, guilt, indecisiveness, loss of libido and poor concentration. Diagnoses: Major depressive disorder, recurrent, moderate, rule out OCD   Diagnosis:  Axis I: Major depressive disorder, recurrent episode, moderate          Axis II: Deferred

## 2012-11-03 ENCOUNTER — Ambulatory Visit (INDEPENDENT_AMBULATORY_CARE_PROVIDER_SITE_OTHER): Payer: 59 | Admitting: Psychiatry

## 2012-11-03 DIAGNOSIS — F331 Major depressive disorder, recurrent, moderate: Secondary | ICD-10-CM

## 2012-11-03 NOTE — Patient Instructions (Signed)
Discussed orally 

## 2012-11-03 NOTE — Progress Notes (Signed)
   Patient:  Alejandro Murphy   DOB: [birth year]  MR Number: [id number]  Location: [facility] Center:  [address] [place],  [place], [address]  Start: Thursday 11/03/2012 3:00 PM End: Thursday 11/03/2012 3:50 PM  Provider/Observer:     Florencia Reasons, MSW, LCSW   Chief Complaint:      Chief Complaint  Patient presents with  . Anxiety  . Depression    Reason For Service:    Patient is referred for followup services upon his discharge from hospitalization at the behavioral health center in [place] where he was treated for depression, anxiety, and suicidal ideations. Patient reports a long-standing history of depression and anxiety along with 4 psychiatric hospitalizations. He says his last hospitalization was triggered by feelings of failure as he has not accomplished much since being out of high school. 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 He reports becoming distraught and considering pulling out in front of another car. He informed his mother and voluntarily admitted himself to the hospital. Patient reports suffering from depression since middle school. Patient is seen for followup appointment today.    Interventions Strategy:  Supportive therapy  Participation Level:   Active  Participation Quality:  Appropriate      Behavioral Observation:  Casual, Alert,  talkative  Current Psychosocial Factors: Patient recently obtained a job.  Content of Session:   Reviewing symptoms, processing feelings, reinforcing patient's efforts to increase involvement in activity and to maintain consistency, discussing patient's adjustment to his new job  Current Status:   The patient reports improved mood and decreased anxiety . 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 Patient began working this past Tuesday. He works second shift, 5:30 PM to 3 AM. Patient reports really liking job and is hopeful position will become permanent. He reports liking coworkers but experiencing some adjustment issues trying to understand their accents as many of them are foreigners. Patient is pleased with his progress and states being happier along with looking forward to being paid. He also reports strong support from parents regarding his efforts. Therapist encourages patient to maintain self-care efforts   Target Goals:   1. Increase social involvement and social interaction including attending church: 1:1 psychotherapy one time every 1-4 weeks (supportive, CBT)    2. Increase physical activity and exercise: 1 psychotherapy one time every 1-4 weeks (supportive, CBT)    3. Increase self acceptance: 1:1 psychotherapy one time every 1-4 weeks (supportive, CBT)  Last Reviewed:   07/12/2012  Goals Addressed Today:    Goals 2 and 3  Impression/Diagnosis:   The patient presents with a history of symptoms of depression since middle school. He has had 4 psychiatric hospitalizations with the most recent one occurring in March 2014 due to to depression and suicidal thoughts. His current symptoms include depressed mood, anxiety, loss of interest in activities, irritability, excessive worrying, obsessive thoughts, low self-esteem, guilt, indecisiveness, loss of libido and poor concentration. Diagnoses: Major depressive disorder, recurrent, moderate, rule out OCD   Diagnosis:  Axis I: Major depressive disorder, recurrent episode, moderate          Axis II: Deferred

## 2012-11-10 ENCOUNTER — Encounter (HOSPITAL_COMMUNITY): Payer: Self-pay | Admitting: Emergency Medicine

## 2012-11-10 ENCOUNTER — Emergency Department (HOSPITAL_COMMUNITY)
Admission: EM | Admit: 2012-11-10 | Discharge: 2012-11-10 | Disposition: A | Discharge status: Home / Self Care | Payer: 59 | Attending: Emergency Medicine | Admitting: Emergency Medicine

## 2012-11-10 DIAGNOSIS — R197 Diarrhea, unspecified: Secondary | ICD-10-CM | POA: Insufficient documentation

## 2012-11-10 DIAGNOSIS — Z79899 Other long term (current) drug therapy: Secondary | ICD-10-CM | POA: Insufficient documentation

## 2012-11-10 DIAGNOSIS — F3289 Other specified depressive episodes: Secondary | ICD-10-CM | POA: Insufficient documentation

## 2012-11-10 DIAGNOSIS — F329 Major depressive disorder, single episode, unspecified: Secondary | ICD-10-CM | POA: Insufficient documentation

## 2012-11-10 DIAGNOSIS — F909 Attention-deficit hyperactivity disorder, unspecified type: Secondary | ICD-10-CM | POA: Insufficient documentation

## 2012-11-10 DIAGNOSIS — R112 Nausea with vomiting, unspecified: Secondary | ICD-10-CM | POA: Insufficient documentation

## 2012-11-10 LAB — CBC
MCV: 81.3 fL (ref 78.0–100.0)
Platelets: 173 10*3/uL (ref 150–400)
RBC: 5.5 MIL/uL (ref 4.22–5.81)
RDW: 12.9 % (ref 11.5–15.5)
WBC: 10.8 10*3/uL — ABNORMAL HIGH (ref 4.0–10.5)

## 2012-11-10 LAB — BASIC METABOLIC PANEL
Chloride: 105 mEq/L (ref 96–112)
Creatinine, Ser: 0.76 mg/dL (ref 0.50–1.35)
GFR calc Af Amer: >90 mL/min (ref >90)
Potassium: 3.9 mEq/L (ref 3.5–5.1)
Sodium: 140 mEq/L (ref 135–145)

## 2012-11-10 MED ORDER — FAMOTIDINE IN NACL 20-0.9 MG/50ML-% IV SOLN
20.0000 mg | Freq: Once | INTRAVENOUS | Status: AC
  Administered 2012-11-10: 20 mg via INTRAVENOUS
  Filled 2012-11-10 (×2): qty 50

## 2012-11-10 MED ORDER — METOCLOPRAMIDE HCL 5 MG/ML IJ SOLN
10.0000 mg | Freq: Once | INTRAMUSCULAR | Status: AC
  Administered 2012-11-10: 10 mg via INTRAVENOUS
  Filled 2012-11-10: qty 2

## 2012-11-10 MED ORDER — MORPHINE SULFATE 4 MG/ML IJ SOLN
4.0000 mg | Freq: Once | INTRAMUSCULAR | Status: AC
  Administered 2012-11-10: 4 mg via INTRAVENOUS
  Filled 2012-11-10: qty 1

## 2012-11-10 MED ORDER — SODIUM CHLORIDE 0.9 % IV BOLUS (SEPSIS)
1000.0000 mL | Freq: Once | INTRAVENOUS | Status: AC
  Administered 2012-11-10: 1000 mL via INTRAVENOUS

## 2012-11-10 MED ORDER — DICYCLOMINE HCL 20 MG PO TABS: 20.0000 mg | ORAL_TABLET | Freq: Two times a day (BID) | ORAL | Status: DC

## 2012-11-10 MED ORDER — PROMETHAZINE HCL 25 MG PO TABS: 25.0000 mg | ORAL_TABLET | Freq: Four times a day (QID) | ORAL | Status: DC | PRN

## 2012-11-10 MED ORDER — ONDANSETRON HCL 4 MG/2ML IJ SOLN
4.0000 mg | Freq: Once | INTRAMUSCULAR | Status: AC
  Administered 2012-11-10: 4 mg via INTRAVENOUS
  Filled 2012-11-10: qty 2

## 2012-11-10 NOTE — ED Provider Notes (Signed)
History    CSN: [id number] Arrival date & time 11/10/12  0546  First MD Initiated Contact with Patient 11/10/12 [phone]     Chief Complaint  Patient presents with  . Nausea   (Consider location/radiation/quality/duration/timing/severity/associated sxs/prior Treatment) HPI  WLLIAM GROSSO is a 22 y.o. male was otherwise healthy complaining of nausea, vomiting, diarrhea onset last night at approximately 1 AM. Patient has had multiple episodes of nonbloody, nonbilious, no coffee ground emesis associated with watery diarrhea. He has diffuse abdominal cramping which occurred after the vomiting. Patient denies fever, sick contacts, recent travel Patient states that he ate a chili dog from a vending machine prior to this episode.   Past Medical History  Diagnosis Date  . Depression   . ADHD (attention deficit hyperactivity disorder)    Past Surgical History  Procedure Laterality Date  . Cholecystectomy  Per pt in 2012  . Wisdom tooth extraction Bilateral    Family History  Problem Relation Age of Onset  . Anxiety disorder Mother   . Alcohol abuse Father   . Dementia Paternal Grandmother     PGGM at age 24  . Seizures Paternal Grandmother     PGGM  . ADD / ADHD Neg Hx   . Bipolar disorder Neg Hx   . Depression Neg Hx   . Drug abuse Neg Hx   . OCD Neg Hx   . Paranoid behavior Neg Hx   . Schizophrenia Neg Hx    History  Substance Use Topics  . Smoking status: Never Smoker   . Smokeless tobacco: Never Used  . Alcohol Use: No    Review of Systems  Constitutional:       Negative except as described in HPI  HENT:       Negative except as described in HPI  Respiratory:       Negative except as described in HPI  Cardiovascular:       Negative except as described in HPI  Gastrointestinal:       Negative except as described in HPI  Genitourinary:       Negative except as described in HPI  Musculoskeletal:       Negative except as described in HPI  Skin:       Negative  except as described in HPI  Neurological:       Negative except as described in HPI  All other systems reviewed and are negative.    Allergies  Review of patient's allergies indicates no known allergies.  Home Medications   Current Outpatient Rx  Name  Route  Sig  Dispense  Refill  . Attapulgite (KAOPECTATE PO)   Oral   Take by mouth once.         . calcium carbonate (TUMS - DOSED IN MG ELEMENTAL CALCIUM) 500 MG chewable tablet   Oral   Chew 1 tablet by mouth every 2 (two) hours as needed for heartburn.         . carbamazepine (TEGRETOL) 200 MG tablet   Oral   Take 600 mg by mouth at bedtime.         Marland Kitchen FLUoxetine (PROZAC) 10 MG tablet   Oral   Take 15 mg by mouth daily.          BP 124/57  Pulse 78  Temp(Src) 97.9 F (36.6 C) (Oral)  Resp 18  SpO2 92% Physical Exam  Nursing note and vitals reviewed. Constitutional: He is oriented to person, place, and time. He appears  well-developed and well-nourished. No distress.  Obese, nontoxic appearing  HENT:  Head: Normocephalic.  Mouth/Throat: Oropharynx is clear and moist.  Eyes: Conjunctivae and EOM are normal. Pupils are equal, round, and reactive to light.  Cardiovascular: Normal rate, regular rhythm and intact distal pulses.   Pulmonary/Chest: Effort normal and breath sounds normal. No stridor. No respiratory distress. He has no wheezes. He has no rales. He exhibits no tenderness.  Abdominal: Soft. Bowel sounds are normal. He exhibits no distension and no mass. There is tenderness. There is no rebound and no guarding.  Diffusely tender to palpation with no guarding or rebound.  Musculoskeletal: Normal range of motion. He exhibits no edema.  Neurological: He is alert and oriented to person, place, and time.  Psychiatric: He has a normal mood and affect.    ED Course  Procedures (including critical care time) Labs Reviewed  CBC - Abnormal; Notable for the following:    WBC 10.8 (*)    All other components  within normal limits  BASIC METABOLIC PANEL - Abnormal; Notable for the following:    Glucose, Bld 142 (*)    All other components within normal limits   No results found. 1. Nausea vomiting and diarrhea     MDM   Filed Vitals:   11/10/12 0555  BP: 124/57  Pulse: 78  Temp: 97.9 F (36.6 C)  TempSrc: Oral  Resp: 18  SpO2: 92%     DAYON WITT is a 22 y.o. male with acute onset of multiple episodes of nausea vomiting and diarrhea, no fever, nonseptic appearing. Patient has a mild leukocytosis of 10.8 and a hyperglycemia with no ketosis.  Patient is hydrated, given Zofran and Reglan. Patient has passed fluid challenge and is amenable to discharge.  Medications  famotidine (PEPCID) IVPB 20 mg (20 mg Intravenous New Bag/Given 11/10/12 0703)  sodium chloride 0.9 % bolus 1,000 mL (1,000 mLs Intravenous New Bag/Given 11/10/12 0638)  ondansetron (ZOFRAN) injection 4 mg (4 mg Intravenous Given 11/10/12 0640)  morphine 4 MG/ML injection 4 mg (4 mg Intravenous Given 11/10/12 0702)  metoCLOPramide (REGLAN) injection 10 mg (10 mg Intravenous Given 11/10/12 0700)    Pt is hemodynamically stable, appropriate for, and amenable to discharge at this time. Pt verbalized understanding and agrees with care plan. Outpatient follow-up and specific return precautions discussed.    Discharge Medication List as of 11/10/2012  7:22 AM    START taking these medications   Details  dicyclomine (BENTYL) 20 MG tablet Take 1 tablet (20 mg total) by mouth 2 (two) times daily., Starting 11/10/2012, Until Discontinued, Print    promethazine (PHENERGAN) 25 MG tablet Take 1 tablet (25 mg total) by mouth every 6 (six) hours as needed for nausea., Starting 11/10/2012, Until Discontinued, [employer], PA-C 11/12/12 [phone]

## 2012-11-10 NOTE — ED Notes (Signed)
Pt presents to ED with nausea and vomiting and diarrhea.It started last night.Pt also complains of abdominal cramps.

## 2012-11-10 NOTE — ED Notes (Signed)
Pt tolerating water and sprite.

## 2012-11-12 NOTE — ED Provider Notes (Signed)
Medical screening examination/treatment/procedure(s) were performed by non-physician practitioner and as supervising physician I was immediately available for consultation/collaboration.  Jones Skene, M.D.     Jones Skene, MD 11/12/12 [phone]

## 2012-11-18 ENCOUNTER — Ambulatory Visit (INDEPENDENT_AMBULATORY_CARE_PROVIDER_SITE_OTHER): Payer: 59 | Admitting: Psychiatry

## 2012-11-18 DIAGNOSIS — F331 Major depressive disorder, recurrent, moderate: Secondary | ICD-10-CM

## 2012-11-18 NOTE — Progress Notes (Signed)
Patient:  Alejandro Murphy   DOB: 05-Oct-1990  MR Number: [id number]  Location: [facility] Center:  [address] [place],  [place], [address]  Start: Friday 11/18/2012 2:10 PM End: Friday 11/18/2012 3:00 PM  Provider/Observer:     Florencia Reasons, MSW, LCSW   Chief Complaint:      Chief Complaint  Patient presents with  . Depression    Reason For Service:    Patient is referred for followup services upon his discharge from hospitalization at the behavioral health center in [place] where he was treated for depression, anxiety, and suicidal ideations. Patient reports a long-standing history of depression and anxiety along with 4 psychiatric hospitalizations. He says his last hospitalization was triggered by feelings of failure as he has not accomplished much since being out of high school. 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 He reports becoming distraught and considering pulling out in front of another car. He informed his mother and voluntarily admitted himself to the hospital. Patient reports suffering from depression since middle school. Patient is seen for followup appointment today.    Interventions Strategy:  Supportive therapy  Participation Level:   Active  Participation Quality:  Appropriate      Behavioral Observation:  Casual, Alert,  talkative  Current Psychosocial Factors: Patient recently obtained a job.  Content of Session:   Reviewing symptoms, processing feelings,  discussing patient's adjustment to his new job, exploring options, identifying triggers of anger, identifying coping statements and relaxation techniques  Current Status:   The patient reports improved mood and decreased anxiety. He rates depression and anxiety at 2 on 10 point scale with one being none in 10 being severe.  He denies suicidal ideations, homicidal ideations, and hallucinations  Patient Progress:   .Patient  reports continued adjustment to his temporary job. He reports experiencing violent thoughts ( with no plan or intent to harm anyone) and depression about a week ago. This was triggered by patient's boss whom he liked being fired, patient being sick, and patient being annoyed with his work environment.  Patient reports observing that he becomes angry when he feels insecure and unsafe. Patient and therapist discuss coping and relaxation techniques including coping statements, visualization, and reading. Patient also has been writing his thoughts. 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 Patient states being bored . He also dislikes job due to the dust. He has met coworkers whom he likes. He also has gained some  information about  the type of work he wants to pursue and states wanting to work with people or animals.   Patient plans to contact the temporary service agency to request a different assignment.       Target Goals:   1. Increase social involvement and social interaction including attending church: 1:1 psychotherapy one time every 1-4 weeks (supportive, CBT)    2. Increase physical activity and exercise: 1 psychotherapy one time every 1-4 weeks (supportive, CBT)    3. Increase self acceptance: 1:1 psychotherapy one time every 1-4 weeks (supportive, CBT)  Last Reviewed:   07/12/2012  Goals Addressed Today:    Goals 2 and 3  Impression/Diagnosis:   The patient presents with a history of symptoms of depression since middle school. He has had 4 psychiatric hospitalizations with the most recent one occurring in March 2014 due to to depression and suicidal thoughts. His current symptoms include  depressed mood, anxiety, loss of interest in activities, irritability, excessive worrying, obsessive thoughts, low self-esteem, guilt, indecisiveness, loss of libido and poor concentration. Diagnoses: Major depressive disorder, recurrent, moderate, rule out  OCD   Diagnosis:  Axis I: Major depressive disorder, recurrent episode, moderate          Axis II: Deferred

## 2012-11-18 NOTE — Patient Instructions (Signed)
Discussed orally 

## 2012-12-02 ENCOUNTER — Ambulatory Visit (INDEPENDENT_AMBULATORY_CARE_PROVIDER_SITE_OTHER): Payer: 59 | Admitting: Psychiatry

## 2012-12-02 DIAGNOSIS — F331 Major depressive disorder, recurrent, moderate: Secondary | ICD-10-CM

## 2012-12-02 NOTE — Progress Notes (Signed)
Patient:  Alejandro Murphy   DOB: 01/16/1991  MR Number: [id number]  Location: Behavioral [facility] Center:  [address] [place],  [place], [address]  Start: Friday 12/02/2012 1:00 PM End: Friday 12/02/2012 1:55 PM  Provider/Observer:     Florencia Reasons, MSW, LCSW   Chief Complaint:      Chief Complaint  Patient presents with  . Anxiety  . Depression    Reason For Service:    Patient is referred for followup services upon his discharge from hospitalization at the behavioral health center in [place] where he was treated for depression, anxiety, and suicidal ideations. Patient reports a long-standing history of depression and anxiety along with 4 psychiatric hospitalizations. He says his last hospitalization was triggered by feelings of failure as he has not accomplished much since being out of high school. 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 He reports becoming distraught and considering pulling out in front of another car. He informed his mother and voluntarily admitted himself to the hospital. Patient reports suffering from depression since middle school. Patient is seen for followup appointment today.    Interventions Strategy:  Supportive therapy  Participation Level:   Active  Participation Quality:  Appropriate      Behavioral Observation:  Casual, Alert,  talkative  Current Psychosocial Factors: Patient recently quit his job.   Content of Session:   Reviewing symptoms, processing feelings, working with patient to identify ways to increase structure and involvement in activity,  Current Status:   The patient reports improved mood and decreased anxiety.  He denies suicidal ideations, homicidal ideations, and hallucinations  Patient Progress:   Patient reports quitting his job due to becoming bored with his work and feeling isolated. He also reports becoming frustrated as he was reprimanded for  returning from lunch 5 minutes late. Patient has decided to go to [employer] spring semester. He completed an application and took the placement test this week.  His parents are going on vacation and patient expresses some concerns about possibly being lonely. Therapist works with patient to identify ways to increase social contact and to increase involvement in activity. Patient continues to experience violent thoughts (with no plan or intent to harm anyone) but reports decreased intensity and frequency. Patient is scheduled to see psychiatrist Dr. Tenny Craw  next week for medication management. Patient agrees to complete plan your day handouts and rating forms and bring to next session.     Target Goals:   1. Increase social involvement and social interaction including attending church: 1:1 psychotherapy one time every 1-4 weeks (supportive, CBT)    2. Increase physical activity and exercise: 1 psychotherapy one time every 1-4 weeks (supportive, CBT)    3. Increase self acceptance: 1:1 psychotherapy one time every 1-4 weeks (supportive, CBT)  Last Reviewed:   07/12/2012  Goals Addressed Today:    Goals 2 and 3  Impression/Diagnosis:   The patient presents with a history of symptoms of depression since middle school. He has had 4 psychiatric hospitalizations with the most recent one occurring in March 2014 due to to depression and suicidal thoughts. His current symptoms include depressed mood, anxiety, loss of interest in activities, irritability, excessive worrying, obsessive thoughts, low self-esteem, guilt, indecisiveness, loss of libido and poor concentration. Diagnoses: Major depressive disorder, recurrent, moderate, rule out OCD   Diagnosis:  Axis I: Major depressive disorder, recurrent episode, moderate  Axis II: Deferred

## 2012-12-02 NOTE — Patient Instructions (Signed)
Discussed orally 

## 2012-12-08 ENCOUNTER — Ambulatory Visit (HOSPITAL_COMMUNITY): Payer: 59 | Admitting: Psychiatry

## 2012-12-08 ENCOUNTER — Ambulatory Visit (HOSPITAL_COMMUNITY): Payer: Self-pay | Admitting: Psychiatry

## 2013-06-15 ENCOUNTER — Ambulatory Visit (HOSPITAL_COMMUNITY): Payer: Self-pay | Admitting: Psychiatry

## 2013-06-28 ENCOUNTER — Ambulatory Visit (INDEPENDENT_AMBULATORY_CARE_PROVIDER_SITE_OTHER): Payer: 59 | Admitting: Psychiatry

## 2013-06-28 DIAGNOSIS — F323 Major depressive disorder, single episode, severe with psychotic features: Secondary | ICD-10-CM

## 2013-06-28 NOTE — Patient Instructions (Signed)
Discussed orally 

## 2013-06-28 NOTE — Progress Notes (Addendum)
   THERAPIST PROGRESS NOTE  Session Time:  Wednesday 06/28/2013 2:00 PM - 2:50 PM  Participation Level: Active  Behavioral Response: CasualAlertAnxious  Type of Therapy: Individual Therapy  Treatment Goals addressed: Improve ability to manage stress and anxiety  Interventions: Supportive  Summary: Alejandro Murphy is a 23 y.o. male who presents with a history of symptoms of depression since middle school. He has had 4 psychiatric hospitalizations with the most recent one occurring in March 2014 due to to depression and suicidal thoughts.  He is resuming services today due to experiencing increased anxiety and stress. He is pleased he has begun attending school at [employer]. He has increased opportunity for social interaction but reports severe anxiety initiating and maintaining conversations. Patient also reports increased mood swings and anger . He reluctantly admits he has visual and auditory hallucinations. He has heard voices that he says could be thoughts. He still has violent thoughts and says he had plans that may or may not be his own of  joining the [employer], obtaining special training, becoming a mercenary, and going after VIPs but having no specific people in mind. Patient admits discontinuing taking his medication several months ago due  to poor medication compliance as he kept forgetting to take medication. Patient agrees to schedule an appointment with psychiatrist Dr. Tenny Crawoss a soon as possible for medication management. He agrees to call this practice, call 911, or have someone to take him to the emergency room should symptoms worsen.   Suicidal/Homicidal: Patient denies any suicidal ideations. He reports he has had plans of joining the military to obtain training to take out VIPs but has not acted on plan and denies any current plan or intent of harming anyone.  Therapist Response: Therapist praises patient's efforts to resume treatment and for being forthcoming regarding hallucinations  and thoughts. Therapist encourages patient to resume physical activity. Therapist also discusses the importance of medication compliance and works with patient to facilitate scheduling an appointment with psychiatrist Dr. Tenny Crawoss.  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

## 2013-07-04 ENCOUNTER — Encounter (HOSPITAL_COMMUNITY): Payer: Self-pay | Admitting: Psychiatry

## 2013-07-04 ENCOUNTER — Ambulatory Visit (HOSPITAL_COMMUNITY): Payer: Self-pay | Admitting: Psychiatry

## 2013-07-05 ENCOUNTER — Telehealth (HOSPITAL_COMMUNITY): Payer: Self-pay | Admitting: *Deleted

## 2013-07-06 ENCOUNTER — Ambulatory Visit (INDEPENDENT_AMBULATORY_CARE_PROVIDER_SITE_OTHER): Payer: 59 | Admitting: Psychiatry

## 2013-07-06 ENCOUNTER — Encounter (HOSPITAL_COMMUNITY): Payer: Self-pay | Admitting: Psychiatry

## 2013-07-06 VITALS — BP 138/90 | Ht 71.0 in | Wt 274.0 lb

## 2013-07-06 DIAGNOSIS — F259 Schizoaffective disorder, unspecified: Secondary | ICD-10-CM

## 2013-07-06 DIAGNOSIS — F411 Generalized anxiety disorder: Secondary | ICD-10-CM

## 2013-07-06 DIAGNOSIS — F988 Other specified behavioral and emotional disorders with onset usually occurring in childhood and adolescence: Secondary | ICD-10-CM

## 2013-07-06 DIAGNOSIS — F3130 Bipolar disorder, current episode depressed, mild or moderate severity, unspecified: Secondary | ICD-10-CM

## 2013-07-06 NOTE — Patient Instructions (Signed)
Take Latuda 40 mg with dinner for one week, then increase to 60 mg with dinner
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
## 2013-07-21 ENCOUNTER — Ambulatory Visit (HOSPITAL_COMMUNITY): Payer: Self-pay | Admitting: Psychiatry

## 2013-07-24 ENCOUNTER — Ambulatory Visit (INDEPENDENT_AMBULATORY_CARE_PROVIDER_SITE_OTHER): Payer: 59 | Admitting: Psychiatry

## 2013-07-24 DIAGNOSIS — F259 Schizoaffective disorder, unspecified: Secondary | ICD-10-CM

## 2013-07-24 NOTE — Progress Notes (Signed)
   THERAPIST PROGRESS NOTE  Session Time: Monday 07/24/2013 10:30 AM - 10:55 AM  Participation Level: Active  Behavioral Response: CasualAlertAnxious  Type of Therapy: Individual Therapy  Treatment Goals addressed: Improve ability to manage stress and anxiety   Interventions: CBT and Supportive  Summary: Alejandro Murphy is a 23 y.o. male who presents with a history of symptoms of depression since middle school. He has had 4 psychiatric hospitalizations with the most recent one occurring in March 2014 due to to depression and suicidal thoughts. He also has experienced violent thoughts and hallucinations. Since last session, he reports absence of hallucinations and decreased violent thoughts( less than one time per week). He denies any impulses to act onviolent thoughts He reports taking Latuda as prescribed by Dr. Harrington Challenger has been helpful but says it makes him tired. He will discuss at next medication management appointment next week.He reports doing well in school but becoming anxious regarding his math test as instructor wants him to write down all steps of the formula for a problem. Patient states his mind goes too fast to do this. 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 He again says his mind goes too fast for his hands to be able to keep up with thoughts. Patient reports he has begun to try to reach out more socially and reports having a good friendship with someone he met 3 months ago.   Suicidal/Homicidal: No  Therapist Response: Therapist works with patient to identify and verbalize feelings and to identify coping techniques including relaxation breathing, talking with a friend, and using a stress ball or putty  Plan: Return again in 2 weeks.  Diagnosis: Axis I: Schizoaffective Disorder    Axis II: Deferred    Addysen Louth, LCSW 07/24/2013

## 2013-07-24 NOTE — Patient Instructions (Signed)
Discussed orally 

## 2013-07-28 ENCOUNTER — Ambulatory Visit (HOSPITAL_COMMUNITY): Payer: Self-pay | Admitting: Psychiatry

## 2013-07-28 ENCOUNTER — Encounter (HOSPITAL_COMMUNITY): Payer: Self-pay | Admitting: Psychiatry

## 2013-08-07 ENCOUNTER — Ambulatory Visit (INDEPENDENT_AMBULATORY_CARE_PROVIDER_SITE_OTHER): Payer: 59 | Admitting: Psychiatry

## 2013-08-09 ENCOUNTER — Encounter (HOSPITAL_COMMUNITY): Payer: Self-pay | Admitting: Psychiatry

## 2013-08-09 ENCOUNTER — Ambulatory Visit (INDEPENDENT_AMBULATORY_CARE_PROVIDER_SITE_OTHER): Payer: 59 | Admitting: Psychiatry

## 2013-08-09 VITALS — BP 120/80 | Ht 71.0 in | Wt 278.0 lb

## 2013-08-09 DIAGNOSIS — F411 Generalized anxiety disorder: Secondary | ICD-10-CM

## 2013-08-09 DIAGNOSIS — F988 Other specified behavioral and emotional disorders with onset usually occurring in childhood and adolescence: Secondary | ICD-10-CM

## 2013-08-09 DIAGNOSIS — F259 Schizoaffective disorder, unspecified: Secondary | ICD-10-CM

## 2013-08-09 DIAGNOSIS — F3130 Bipolar disorder, current episode depressed, mild or moderate severity, unspecified: Secondary | ICD-10-CM

## 2013-08-09 MED ORDER — LURASIDONE HCL 20 MG PO TABS: 20.0000 mg | ORAL_TABLET | Freq: Every day | ORAL | Status: DC
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
## 2013-08-23 ENCOUNTER — Ambulatory Visit (INDEPENDENT_AMBULATORY_CARE_PROVIDER_SITE_OTHER): Payer: 59 | Admitting: Psychiatry

## 2013-08-23 DIAGNOSIS — F259 Schizoaffective disorder, unspecified: Secondary | ICD-10-CM

## 2013-08-24 NOTE — Progress Notes (Signed)
   THERAPIST PROGRESS NOTE  Session Time: Wednesday 08/23/2013 3:05 PM - 3:55 PM  Participation Level: Active  Behavioral Response: CasualAlertAnxious  Type of Therapy: Individual Therapy  Treatment Goals addressed: Improve ability to manage stress and anxiety   Interventions: CBT and Supportive  Summary: Alejandro Murphy is a 23 y.o. male who presents with a history of symptoms of depression since middle school. He has had 4 psychiatric hospitalizations with the most recent one occurring in March 2014 due to to depression and suicidal thoughts. He also has experienced violent thoughts and hallucinations. Since last session, patient reports feeling better and less sedated since psychiatrist Dr. Tenny Crawoss has changed his medication. He reports no hallucinations and decreased violent thoughts. He is doing well in school but is anxious about math class as he his grade point average in class has dropped although he still has an A. He has decided to pursue degree in culinary arts and hopes to attend summer school. He reports continued strong support from family. He expresses disappoint regarding recent job interview as he did not get the job. He report increased social involvement and going out to dinner with friends.    Suicidal/Homicidal: Patient denies suicidal and homicidal thoughts. He states occasionally wanting vigilante justice when hearing about someone mistreating others  but has no intent or plan.  Therapist Response: Patient works with patient to reframe negative thoughts, reinforce social involvement, and to review relaxation techniques.  Plan: Return again in 3 weeks.  Diagnosis: Axis I: Schizoaffective Disorder    Axis II: Deferred    Kimla Furth, LCSW 08/24/2013

## 2013-09-06 ENCOUNTER — Ambulatory Visit (HOSPITAL_COMMUNITY): Payer: Self-pay | Admitting: Psychiatry

## 2013-09-20 ENCOUNTER — Ambulatory Visit (INDEPENDENT_AMBULATORY_CARE_PROVIDER_SITE_OTHER): Payer: 59 | Admitting: Psychiatry

## 2013-09-20 DIAGNOSIS — F259 Schizoaffective disorder, unspecified: Secondary | ICD-10-CM

## 2013-09-21 ENCOUNTER — Ambulatory Visit (INDEPENDENT_AMBULATORY_CARE_PROVIDER_SITE_OTHER): Payer: 59 | Admitting: Psychiatry

## 2013-09-21 ENCOUNTER — Encounter (HOSPITAL_COMMUNITY): Payer: Self-pay | Admitting: Psychiatry

## 2013-09-21 VITALS — BP 120/70 | Ht 71.0 in | Wt 284.0 lb

## 2013-09-21 DIAGNOSIS — F259 Schizoaffective disorder, unspecified: Secondary | ICD-10-CM

## 2013-09-21 DIAGNOSIS — F411 Generalized anxiety disorder: Secondary | ICD-10-CM

## 2013-09-21 DIAGNOSIS — F988 Other specified behavioral and emotional disorders with onset usually occurring in childhood and adolescence: Secondary | ICD-10-CM

## 2013-09-21 DIAGNOSIS — F3130 Bipolar disorder, current episode depressed, mild or moderate severity, unspecified: Secondary | ICD-10-CM

## 2013-09-21 MED ORDER — LURASIDONE HCL 20 MG PO TABS: 20.0000 mg | ORAL_TABLET | Freq: Every day | ORAL | Status: DC

## 2013-09-21 NOTE — Patient Instructions (Signed)
Discussed orally 

## 2013-09-21 NOTE — Progress Notes (Signed)
   THERAPIST PROGRESS NOTE  Session Time: Wednesday 09/20/2013 2:15 PM - 3:00 PM  Participation Level: Active  Behavioral Response: CasualAlertAnxious  Type of Therapy: Individual Therapy  Treatment Goals addressed:  Improve ability to manage stress and anxiety   Interventions: CBT and Supportive  Summary: Alejandro Murphy is a 23 y.o. male who presents with a history of symptoms of depression since middle school. He has had 4 psychiatric hospitalizations with the most recent one occurring in March 2014 due to to depression and suicidal thoughts. He also has experienced violent thoughts and hallucinations. Since last session, patient has successfully completed a semester of school. He is pleased with this but is experiencing anxiety and indecisiveness about choosing a field of study. He is considering culinary and welding but also is thinking about joining the [employer]. He also expresses frustration about where he is in life as he is still residing with his parents and does not have a job. Patient states he is trying to deal with his disillusionment and trying to have more realistic expectaiions. Patient has begun to try to increase exercise.   Suicidal/Homicidal: Patient denies suicidal and homicidal thoughts. He reports occasional violent thoughts but no plan or intent to harm anyone. Patient agrees to call this practice, call 911, or have someone take him to the ER should symptoms worsen.  Therapist Response: Therapist works with patient to process feelings, explore his strengths, reframe negative statements, reinforce patient's efforts to improve self-care.   Plan: Return again in 4 weeks.  Diagnosis: Axis I: Schizoaffective Disorder    Axis II: Deferred    Sarajane Fambrough, LCSW 09/21/2013
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
## 2013-10-19 ENCOUNTER — Ambulatory Visit (HOSPITAL_COMMUNITY): Payer: Self-pay | Admitting: Psychiatry

## 2013-10-19 ENCOUNTER — Encounter (HOSPITAL_COMMUNITY): Payer: Self-pay | Admitting: Psychiatry

## 2013-11-17 ENCOUNTER — Ambulatory Visit (HOSPITAL_COMMUNITY): Payer: Self-pay | Admitting: Psychiatry

## 2013-11-17 ENCOUNTER — Encounter (HOSPITAL_COMMUNITY): Payer: Self-pay | Admitting: Psychiatry

## 2013-11-17 ENCOUNTER — Ambulatory Visit (INDEPENDENT_AMBULATORY_CARE_PROVIDER_SITE_OTHER): Payer: 59 | Admitting: Psychiatry

## 2013-11-17 DIAGNOSIS — F251 Schizoaffective disorder, depressive type: Secondary | ICD-10-CM

## 2013-11-17 DIAGNOSIS — F259 Schizoaffective disorder, unspecified: Secondary | ICD-10-CM

## 2013-11-23 NOTE — Patient Instructions (Signed)
Discussed orally 

## 2013-11-23 NOTE — Progress Notes (Signed)
   THERAPIST PROGRESS NOTE  Session Time: Friday 11/17/2013  4:25 PM - 4:50 PM  Participation Level: Active  Behavioral Response: CasualAlertAnxious  Type of Therapy: Individual Therapy  Treatment Goals addressed:  Improve ability to manage stress and anxiety   Interventions: CBT and Supportive  Summary: Alejandro Murphy is a 23 y.o. male who presents with a history of symptoms of depression since middle school. He has had 4 psychiatric hospitalizations with the most recent one occurring in March 2014 due to to depression and suicidal thoughts. He also has experienced violent thoughts and hallucinations. Since last session, he worked at [employer] but quit after 2-3 weeks as he said it was a negative environment. Per patient's report, people talked bad about him. He also reports his father was injured at work. As a result, he is not able to return to school in the fall. He admits initially being disappointed but trying to look at in a positive manner saying this will give him time to look for employment. He also states he can still study. He continues to socialize with friends. He reports increasedl intrusive violent thoughts with no plans to harm self or anyone else. He admits forgetting to take medication for about a week due to previous work schedule but resumed taking medication consistently about a week ago per his report.    Suicidal/Homicidal: No suicidal or homicidal ideations. Patient admits intrusive violent thoughts with no intent or plan to harm self or anyone else. Patient agrees to call this practice, call 911, or have someone take him to the emergency room should symptoms worsen.  Therapist Response: Therapist works with patient to process feelings, identify coping statements, encourage medication compliance, and identify relaxation techniques  Plan: Return again in 2-3 weeks.  Diagnosis: Axis I: Schizoaffective Disorder    Axis II: Deferred    Alejandro Figueira, LCSW 11/23/2013

## 2013-12-18 ENCOUNTER — Ambulatory Visit (INDEPENDENT_AMBULATORY_CARE_PROVIDER_SITE_OTHER): Payer: 59 | Admitting: Psychiatry

## 2013-12-18 DIAGNOSIS — F3289 Other specified depressive episodes: Secondary | ICD-10-CM

## 2013-12-18 DIAGNOSIS — F251 Schizoaffective disorder, depressive type: Secondary | ICD-10-CM

## 2013-12-18 DIAGNOSIS — F259 Schizoaffective disorder, unspecified: Secondary | ICD-10-CM

## 2013-12-18 DIAGNOSIS — F329 Major depressive disorder, single episode, unspecified: Secondary | ICD-10-CM

## 2013-12-19 NOTE — Patient Instructions (Signed)
Discussed orally 

## 2013-12-19 NOTE — Progress Notes (Signed)
   THERAPIST PROGRESS NOTE  Session Time: Monday 12/18/2013 4:10 PM - 5:00 PM  Participation Level: Active  Behavioral Response: CasualAlertAnxious  Type of Therapy: Individual Therapy  Treatment Goals addressed:  Improve ability to manage stress and anxiety   Interventions: CBT and Supportive  Summary: Alejandro Murphy is a 23 y.o. male who presents with a history of symptoms of depression since middle school. He has had 4 psychiatric hospitalizations with the most recent one occurring in March 2014 due to to depression and suicidal thoughts. He also has experienced violent thoughts and hallucinations. Since last session, patient has increased self-care efforts regarding exercising regularly. He admits poor sleep pattern and says he has been inconsistent regarding taking medication as a result of this. He resumed taking taking medication consistently about a week ago per patient's reportHe reports things are going well at home and reports having positive interaction with parents.  He reports less worry about dad now that he sees father is recovering well from arm surgery.  He has maintained social involvement with friends and reports going out 1-2 x per week. 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 He reports angry thoughts about people in his past but denies any plans or intent to harm anyone. Patient states wanting to be able to forgive.   Suicidal/Homicidal: No suicidal or homicidal ideations. Patient admits intrusive violent thoughts with no intent or plan to harm self or anyone else. Patient agrees to call this practice, call 911, or have someone take him to the emergency room should symptoms worsen.   Therapist Response: Therapist works with patient to process feelings, identify coping statements, encourage medication compliance, and identify relaxation techniques   Plan: Return again in  2weeks.  Diagnosis: Axis I: Schizoaffective Disorder    Axis II: No diagnosis    Nicky Kras, LCSW 12/19/2013

## 2013-12-21 ENCOUNTER — Encounter (HOSPITAL_COMMUNITY): Payer: Self-pay | Admitting: Psychiatry

## 2013-12-21 ENCOUNTER — Ambulatory Visit (INDEPENDENT_AMBULATORY_CARE_PROVIDER_SITE_OTHER): Payer: 59 | Admitting: Psychiatry

## 2013-12-21 VITALS — BP 144/75 | HR 65 | Ht 71.0 in | Wt 278.4 lb

## 2013-12-21 DIAGNOSIS — F411 Generalized anxiety disorder: Secondary | ICD-10-CM

## 2013-12-21 DIAGNOSIS — F251 Schizoaffective disorder, depressive type: Secondary | ICD-10-CM

## 2013-12-21 DIAGNOSIS — F909 Attention-deficit hyperactivity disorder, unspecified type: Secondary | ICD-10-CM

## 2013-12-21 DIAGNOSIS — F3130 Bipolar disorder, current episode depressed, mild or moderate severity, unspecified: Secondary | ICD-10-CM

## 2013-12-21 MED ORDER — LURASIDONE HCL 20 MG PO TABS: 20.0000 mg | ORAL_TABLET | Freq: Every day | ORAL | Status: DC

## 2013-12-21 MED ORDER — ESCITALOPRAM OXALATE 20 MG PO TABS: 20.0000 mg | ORAL_TABLET | Freq: Every day | ORAL | Status: DC
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
## 2014-01-08 ENCOUNTER — Telehealth (HOSPITAL_COMMUNITY): Payer: Self-pay | Admitting: *Deleted

## 2014-01-08 ENCOUNTER — Ambulatory Visit (INDEPENDENT_AMBULATORY_CARE_PROVIDER_SITE_OTHER): Payer: 59 | Admitting: Psychiatry

## 2014-01-08 DIAGNOSIS — F259 Schizoaffective disorder, unspecified: Secondary | ICD-10-CM

## 2014-01-08 DIAGNOSIS — F251 Schizoaffective disorder, depressive type: Secondary | ICD-10-CM

## 2014-01-08 NOTE — Patient Instructions (Signed)
Discussed orally 

## 2014-01-08 NOTE — Telephone Encounter (Signed)
Pt came in to see Peggy 01-08-14 and stated his latest medication he had recently started taking (Lexapro) made him feel fatigue, a bit blurry vision, could not eat like normal, could not do normal household chores, difficulties thinking and gived him severe headaches. Per pt he stopped taking medication 4 days after starting medication. Pt wanted to talk to Dr. Tenny Craw on what to do [phone]

## 2014-01-08 NOTE — Progress Notes (Signed)
   THERAPIST PROGRESS NOTE  Session Time:  Monday 01/08/2014 4:10 PM - 4:55 PM  Participation Level: Active  Behavioral Response: CasualAlertAnxious  Type of Therapy: Individual Therapy  Treatment Goals addressed:  Improve ability to manage stress and anxiety  Interventions: CBT and Supportive  Summary: KAGAN MUTCHLER is a 23 y.o. male who presents with a history of symptoms of depression since middle school. He has had 4 psychiatric hospitalizations with the most recent one occurring in March 2014 due to to depression and suicidal thoughts. He also has experienced violent thoughts and hallucinations. Since last session, patient has continued self-care efforts regarding exercising regularly and reports feeling better and more energized. He has maintained social involvement with friends.He reports taking lexapro but discontinuing after 4 days due to side effects. He will discuss this with Dr. Tenny Craw. He also reports continued sleep difficulty and moments of being in a daze. He is scheduled to see psychiatrist Dr. Tenny Craw on 01/18/2014 but will leave message for Dr. Tenny Craw with medical assistant today regarding side effects. Patient continues to have violent thoughts but but denies any plan or intent to harm self or or anyone else. Patient reports becoming more forgiving and using self-talk to manage anger. He expresses frustration with self as he is not where he wants to be in life and compares his earnings to his father's earnings. Patient states wanting to be more independent.    Suicidal/Homicidal: Patient reports continued violent thoughts at times but denies any plan or intent to harm anyone. He agrees to call this practice, call 911, or have someone take him to the ER should symptoms worsen.  Therapist Response: Therapist works with patient to reinforce improved self-care efforts, to identify and verbalize feelings, identify accomplishments and realistic expectations of self, identify ways to  organize time to pursue interests  Plan: Return again in 3-4 weeks.  Diagnosis: Axis I: Schizoaffective Disorder    Axis II: Deferred    Karlye Ihrig, LCSW 01/08/2014

## 2014-01-09 NOTE — Telephone Encounter (Signed)
Pt is aware of Dr. Tenny Craw dissions and shows understanding.

## 2014-01-09 NOTE — Telephone Encounter (Signed)
Stay off it for now. We will discuss it at next appointment

## 2014-01-18 ENCOUNTER — Encounter (HOSPITAL_COMMUNITY): Payer: Self-pay | Admitting: Psychiatry

## 2014-01-18 ENCOUNTER — Ambulatory Visit (INDEPENDENT_AMBULATORY_CARE_PROVIDER_SITE_OTHER): Payer: 59 | Admitting: Psychiatry

## 2014-01-18 VITALS — BP 139/76 | HR 78 | Ht 71.0 in | Wt 287.0 lb

## 2014-01-18 DIAGNOSIS — F900 Attention-deficit hyperactivity disorder, predominantly inattentive type: Secondary | ICD-10-CM

## 2014-01-18 DIAGNOSIS — F251 Schizoaffective disorder, depressive type: Secondary | ICD-10-CM

## 2014-01-18 DIAGNOSIS — F3130 Bipolar disorder, current episode depressed, mild or moderate severity, unspecified: Secondary | ICD-10-CM

## 2014-01-18 DIAGNOSIS — F411 Generalized anxiety disorder: Secondary | ICD-10-CM

## 2014-01-18 MED ORDER — LURASIDONE HCL 20 MG PO TABS
20.0000 mg | ORAL_TABLET | Freq: Every day | ORAL | Status: DC
Start: 2014-01-18 — End: 2014-03-08
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
## 2014-02-01 ENCOUNTER — Ambulatory Visit (INDEPENDENT_AMBULATORY_CARE_PROVIDER_SITE_OTHER): Payer: 59 | Admitting: Psychiatry

## 2014-02-01 ENCOUNTER — Encounter (HOSPITAL_COMMUNITY): Payer: Self-pay | Admitting: Psychiatry

## 2014-02-01 VITALS — BP 132/104 | HR 75 | Ht 71.0 in | Wt 286.0 lb

## 2014-02-01 DIAGNOSIS — F251 Schizoaffective disorder, depressive type: Secondary | ICD-10-CM

## 2014-02-01 DIAGNOSIS — F3130 Bipolar disorder, current episode depressed, mild or moderate severity, unspecified: Secondary | ICD-10-CM

## 2014-02-01 DIAGNOSIS — F909 Attention-deficit hyperactivity disorder, unspecified type: Secondary | ICD-10-CM

## 2014-02-01 DIAGNOSIS — F411 Generalized anxiety disorder: Secondary | ICD-10-CM

## 2014-02-01 MED ORDER — LISDEXAMFETAMINE DIMESYLATE 40 MG PO CAPS: 40.0000 mg | ORAL_CAPSULE | ORAL | Status: DC
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
## 2014-02-05 ENCOUNTER — Ambulatory Visit (HOSPITAL_COMMUNITY): Payer: Self-pay | Admitting: Psychiatry

## 2014-03-08 ENCOUNTER — Ambulatory Visit (INDEPENDENT_AMBULATORY_CARE_PROVIDER_SITE_OTHER): Payer: 59 | Admitting: Psychiatry

## 2014-03-08 ENCOUNTER — Encounter (HOSPITAL_COMMUNITY): Payer: Self-pay | Admitting: Psychiatry

## 2014-03-08 VITALS — BP 130/84 | Ht 71.0 in | Wt 284.0 lb

## 2014-03-08 DIAGNOSIS — F3130 Bipolar disorder, current episode depressed, mild or moderate severity, unspecified: Secondary | ICD-10-CM

## 2014-03-08 DIAGNOSIS — F411 Generalized anxiety disorder: Secondary | ICD-10-CM

## 2014-03-08 DIAGNOSIS — F900 Attention-deficit hyperactivity disorder, predominantly inattentive type: Secondary | ICD-10-CM

## 2014-03-08 DIAGNOSIS — F251 Schizoaffective disorder, depressive type: Secondary | ICD-10-CM

## 2014-03-08 MED ORDER — LURASIDONE HCL 20 MG PO TABS: 20.0000 mg | ORAL_TABLET | Freq: Every day | ORAL | Status: DC

## 2014-03-08 MED ORDER — LISDEXAMFETAMINE DIMESYLATE 30 MG PO CAPS: 30.0000 mg | ORAL_CAPSULE | ORAL | Status: DC
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
## 2014-04-24 ENCOUNTER — Ambulatory Visit (HOSPITAL_COMMUNITY): Payer: Self-pay | Admitting: Psychiatry

## 2014-04-26 ENCOUNTER — Ambulatory Visit (INDEPENDENT_AMBULATORY_CARE_PROVIDER_SITE_OTHER): Payer: 59 | Admitting: Psychiatry

## 2014-04-26 DIAGNOSIS — F251 Schizoaffective disorder, depressive type: Secondary | ICD-10-CM

## 2014-04-26 NOTE — Patient Instructions (Signed)
Discussed orally 

## 2014-04-26 NOTE — Progress Notes (Signed)
    THERAPIST PROGRESS NOTE  Session Time:  Thursday 04/26/2014 4:10 PM - 4:50 PM  Participation Level: Active  Behavioral Response: CasualAlertAnxious  Type of Therapy: Individual Therapy  Treatment Goals addressed:  Improve ability to manage stress and anxiety  Interventions: CBT and Supportive  Summary: Alejandro ShelterJames S Murphy is a 24 y.o. male who presents with a history of symptoms of depression since middle school. He has had 4 psychiatric hospitalizations with the most recent one occurring in March 2014 due to to depression and suicidal thoughts. He also has experienced violent thoughts and hallucinations. Patient was last seen 4-5 months ago. 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 He states feeling better about self, feeling a sense of accomplishment, and seeing the world in a better way. He states feeling as though a burden has been lifted. He reports this also has had a positive effect on his relationship with his parents who remain supportive. Patient reports no depression and decreased stress. He reports antidepressant has been very helpful. He still experiences some anxiety but is able to manage and reports anxiety isn't constant. He is experiencing some anxiety regarding possibly dating one of his co-worker's daughter who has been flirting with patient per his report. He has continued self-care efforts regarding exercising regularly and has maintained social involvement with friends.   Suicidal/Homicidal: Patient denies suicidal and homicidal thoughts.  Therapist Response: Therapist works with patient to praise his accomplishments, process feelings, identify triggers of anxiety, review relaxation techniques. Plan: Return again in 3-4 weeks.  Diagnosis: Axis I: Schizoaffective Disorder    Axis II: Deferred    BYNUM,PEGGY, LCSW 04/26/2014

## 2014-05-08 ENCOUNTER — Ambulatory Visit (HOSPITAL_COMMUNITY): Payer: Self-pay | Admitting: Psychiatry

## 2014-05-24 ENCOUNTER — Ambulatory Visit (INDEPENDENT_AMBULATORY_CARE_PROVIDER_SITE_OTHER): Payer: 59 | Admitting: Psychiatry

## 2014-05-24 DIAGNOSIS — F251 Schizoaffective disorder, depressive type: Secondary | ICD-10-CM

## 2014-05-24 NOTE — Progress Notes (Signed)
    THERAPIST PROGRESS NOTE  Session Time:  Thursday 05/24/2014  Participation Level: Active  Behavioral Response: CasualAlertAnxious  Type of Therapy: Individual Therapy  Treatment Goals addressed:  Improve ability to manage stress and anxiety  Interventions: CBT and Supportive  Summary: Alejandro Murphy is a 24 y.o. male who presents with a history of symptoms of depression since middle school. He has had 4 psychiatric hospitalizations with the most recent one occurring in March 2014 due to to depression and suicidal thoughts. He also has experienced violent thoughts and hallucinations.   Patient reports increased stress and anxiety since last session. He learned his co-worker's daughter is 24 years old rather than 24 years old. He reports she is very flirtatious with him and being flattered by her attention. He has asked her parents about dating her and was given their permission per his report. They are supposed to go out to dinner or lunch this weekend with her uncle being their chaperone. He admits having a strong physical attraction to her but realizes a physical relationship would be inappropriate. He is conflicted about trying to avoid a dating relationship as he reports feeling accepted by her family and is fearful of losing his job. He has talked with his father about the relationship who has told him to end the relationship.  Suicidal/Homicidal: Patient denies suicidal and homicidal thoughts.  Therapist Response: Therapist works with patient to process feelings, identify possible legal and emotional ramifications of having an inappropriate relationship, identify ways to set and maintain boundaries with the daughter and her family, identify ways to avoid being alone with daughter, identify ways to meet people in his age group  Plan: Return again in 2 weeks.  Diagnosis: Axis I: Schizoaffective Disorder    Axis II: Deferred    Alejandro Gabrielsen, LCSW 05/24/2014

## 2014-05-24 NOTE — Patient Instructions (Signed)
Discussed orally 

## 2014-06-08 ENCOUNTER — Ambulatory Visit (HOSPITAL_COMMUNITY): Payer: Self-pay | Admitting: Psychiatry

## 2014-08-29 ENCOUNTER — Ambulatory Visit (INDEPENDENT_AMBULATORY_CARE_PROVIDER_SITE_OTHER): Payer: 59 | Admitting: Psychiatry

## 2014-08-29 DIAGNOSIS — F251 Schizoaffective disorder, depressive type: Secondary | ICD-10-CM | POA: Diagnosis not present
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
## 2014-08-30 NOTE — Patient Instructions (Signed)
Discussed orally 

## 2014-09-26 ENCOUNTER — Ambulatory Visit (INDEPENDENT_AMBULATORY_CARE_PROVIDER_SITE_OTHER): Payer: 59 | Admitting: Psychiatry

## 2014-09-26 DIAGNOSIS — F251 Schizoaffective disorder, depressive type: Secondary | ICD-10-CM
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
## 2014-09-26 NOTE — Patient Instructions (Signed)
Discussed orally 

## 2014-10-03 ENCOUNTER — Ambulatory Visit (INDEPENDENT_AMBULATORY_CARE_PROVIDER_SITE_OTHER): Payer: 59 | Admitting: Psychiatry

## 2014-10-03 ENCOUNTER — Encounter (HOSPITAL_COMMUNITY): Payer: Self-pay | Admitting: Psychiatry

## 2014-10-03 VITALS — BP 103/79 | HR 81 | Ht 71.0 in | Wt 295.0 lb

## 2014-10-03 DIAGNOSIS — F909 Attention-deficit hyperactivity disorder, unspecified type: Secondary | ICD-10-CM

## 2014-10-03 DIAGNOSIS — F251 Schizoaffective disorder, depressive type: Secondary | ICD-10-CM

## 2014-10-03 MED ORDER — LISDEXAMFETAMINE DIMESYLATE 30 MG PO CAPS: 30.0000 mg | ORAL_CAPSULE | ORAL | Status: DC

## 2014-10-03 MED ORDER — LURASIDONE HCL 20 MG PO TABS: 20.0000 mg | ORAL_TABLET | Freq: Every day | ORAL | Status: DC
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
## 2014-10-26 ENCOUNTER — Ambulatory Visit (HOSPITAL_COMMUNITY): Payer: Self-pay | Admitting: Psychiatry

## 2015-01-02 ENCOUNTER — Ambulatory Visit (HOSPITAL_COMMUNITY): Payer: Self-pay | Admitting: Psychiatry

## 2015-01-03 ENCOUNTER — Ambulatory Visit (HOSPITAL_COMMUNITY): Payer: Self-pay | Admitting: Psychiatry

## 2015-01-11 ENCOUNTER — Encounter (HOSPITAL_COMMUNITY): Payer: Self-pay | Admitting: Vascular Surgery

## 2015-01-11 ENCOUNTER — Emergency Department (HOSPITAL_COMMUNITY)
Admission: EM | Admit: 2015-01-11 | Discharge: 2015-01-11 | Disposition: A | Discharge status: Home / Self Care | Payer: 59 | Attending: Emergency Medicine | Admitting: Emergency Medicine

## 2015-01-11 DIAGNOSIS — F909 Attention-deficit hyperactivity disorder, unspecified type: Secondary | ICD-10-CM | POA: Diagnosis not present

## 2015-01-11 DIAGNOSIS — S8991XA Unspecified injury of right lower leg, initial encounter: Secondary | ICD-10-CM | POA: Insufficient documentation

## 2015-01-11 DIAGNOSIS — F329 Major depressive disorder, single episode, unspecified: Secondary | ICD-10-CM | POA: Diagnosis not present

## 2015-01-11 DIAGNOSIS — S0990XA Unspecified injury of head, initial encounter: Secondary | ICD-10-CM | POA: Insufficient documentation

## 2015-01-11 DIAGNOSIS — Y92410 Unspecified street and highway as the place of occurrence of the external cause: Secondary | ICD-10-CM | POA: Insufficient documentation

## 2015-01-11 DIAGNOSIS — Z79899 Other long term (current) drug therapy: Secondary | ICD-10-CM | POA: Insufficient documentation

## 2015-01-11 DIAGNOSIS — F259 Schizoaffective disorder, unspecified: Secondary | ICD-10-CM | POA: Diagnosis not present

## 2015-01-11 DIAGNOSIS — Y999 Unspecified external cause status: Secondary | ICD-10-CM | POA: Diagnosis not present

## 2015-01-11 DIAGNOSIS — Y9389 Activity, other specified: Secondary | ICD-10-CM | POA: Insufficient documentation

## 2015-01-11 MED ORDER — IBUPROFEN 800 MG PO TABS: 800.0000 mg | ORAL_TABLET | Freq: Three times a day (TID) | ORAL | Status: DC

## 2015-01-11 MED ORDER — METHOCARBAMOL 500 MG PO TABS: 500.0000 mg | ORAL_TABLET | Freq: Two times a day (BID) | ORAL | Status: DC
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
## 2015-01-11 NOTE — Discharge Instructions (Signed)

## 2016-05-18 ENCOUNTER — Ambulatory Visit (HOSPITAL_COMMUNITY)
Admission: EM | Admit: 2016-05-18 | Discharge: 2016-05-18 | Disposition: A | Discharge status: Home / Self Care | Payer: Commercial Managed Care - HMO | Attending: Family Medicine | Admitting: Family Medicine

## 2016-05-18 ENCOUNTER — Encounter (HOSPITAL_COMMUNITY): Payer: Self-pay | Admitting: Emergency Medicine

## 2016-05-18 DIAGNOSIS — S39012A Strain of muscle, fascia and tendon of lower back, initial encounter: Secondary | ICD-10-CM

## 2016-05-18 MED ORDER — PREDNISONE 20 MG PO TABS: ORAL_TABLET | ORAL | 0 refills | Status: DC

## 2016-05-18 MED ORDER — DICLOFENAC SODIUM 75 MG PO TBEC: 75.0000 mg | DELAYED_RELEASE_TABLET | Freq: Two times a day (BID) | ORAL | 0 refills | Status: AC

## 2016-05-18 NOTE — ED Triage Notes (Signed)
[facility] a [facility] of lower back pain x 1 day. The patient stated that he may have injured it on a machine in the gym yesterday.

## 2016-05-18 NOTE — ED Provider Notes (Signed)
[facility]    CSN: [id number] Arrival date & time: 05/18/16  1334     History   Chief Complaint Chief Complaint  Patient presents with  . Back Pain    HPI Alejandro Murphy is a 26 y.o. male.   This 26 year old man who [facility] with acute low back pain of one days duration. He was in the gym doing weights yesterday and felt some pain in his low back and this is persisted.  Patient has had no fever, radiating leg pain, numbness, or weakness. He works as a Research scientist (life sciences)driver doing deliveries.      Past Medical History:  Diagnosis Date  . ADHD (attention deficit hyperactivity disorder)   . Depression   . Schizoaffective disorder [facility](HCC)     Patient Active Problem List   Diagnosis Date Noted  . Schizoaffective disorder (HCC) 07/06/2013  . OCD (obsessive compulsive disorder) 10/12/2012  . Central nervous system complication 07/15/2012  . Bipolar I disorder, most recent episode (or current) depressed, unspecified 06/18/2012    Past Surgical History:  Procedure Laterality Date  . CHOLECYSTECTOMY  Per pt in 2012  . WISDOM TOOTH EXTRACTION Bilateral        Home Medications    Prior to Admission medications   Medication Sig Start Date End Date Taking? Authorizing Provider  diclofenac (VOLTAREN) 75 MG EC tablet Take 1 tablet (75 mg total) by mouth 2 (two) times daily. 05/18/16   Elvina SidleKurt Shaterrica Territo, MD  predniSONE (DELTASONE) 20 MG tablet Two daily with food 05/18/16   Elvina SidleKurt Otisha Spickler, MD    Family History Family History  Problem Relation Age of Onset  . Anxiety disorder Mother   . Alcohol abuse Father   . Dementia Paternal Grandmother     PGGM at age 26  . Seizures Paternal Grandmother     PGGM  . ADD / ADHD Neg Hx   . Bipolar disorder Neg Hx   . Depression Neg Hx   . Drug abuse Neg Hx   . OCD Neg Hx   . Paranoid behavior Neg Hx   . Schizophrenia Neg Hx     Social History Social History  Substance Use  Topics  . Smoking status: Never Smoker  . Smokeless tobacco: Never Used  . Alcohol use No     Allergies   Patient has no known allergies.   Review of Systems Review of Systems  Constitutional: Negative.   HENT: Negative.   Respiratory: Negative.   Cardiovascular: Negative.   Gastrointestinal: Negative.   Genitourinary: Negative.   Musculoskeletal: Positive for back pain.  Neurological: Negative.      Physical Exam Triage Vital Signs ED Triage Vitals [05/18/16 1405]  Enc Vitals Group     BP 135/79     Pulse Rate 63     Resp 18     Temp 97.8 F (36.6 C)     Temp Source Oral     SpO2 99 %     Weight      Height      Head Circumference      Peak Flow      Pain Score 8     Pain Loc      Pain Edu?      Excl. in GC?    No data found.   Updated Vital Signs BP 135/79 (BP Location: Right Arm)   Pulse 63   Temp 97.8 F (36.6 C) (Oral)  Resp 18   SpO2 99%    Physical Exam  Constitutional: He is oriented to person, place, and time. He appears well-developed and well-nourished.  HENT:  Head: Normocephalic.  Right Ear: External ear normal.  Left Ear: External ear normal.  Mouth/Throat: Oropharynx is clear and moist.  Eyes: Conjunctivae are normal.  Neck: Normal range of motion. Neck supple.  Pulmonary/Chest: Effort normal.  Musculoskeletal: Normal range of motion.  Tender L3 on palpation and tender left paraspinal region at the SI joint.  Normal straight leg raising. Good range of motion of lower leg.  Neurological: He is alert and oriented to person, place, and time.  Skin: Skin is warm and dry.  Nursing note and vitals reviewed.    UC Treatments / Results  Labs (all labs ordered are listed, but only abnormal results are displayed) Labs Reviewed - No data to display  EKG  EKG Interpretation None       Radiology No results found.  Procedures Procedures (including critical care time)  Medications Ordered in UC Medications - No data to  display   Initial Impression / Assessment and Plan / UC Course  I have reviewed the triage vital signs and the nursing notes.  Pertinent labs & imaging results that were available during my care of the patient were reviewed by me and considered in my medical decision making (see chart for details).     Final Clinical Impressions(s) / UC Diagnoses   Final diagnoses:  Strain of lumbar region, initial encounter    New Prescriptions New Prescriptions   DICLOFENAC (VOLTAREN) 75 MG EC TABLET    Take 1 tablet (75 mg total) by mouth 2 (two) times daily.   PREDNISONE (DELTASONE) 20 MG TABLET    Two daily with food     Elvina Sidle, MD 05/18/16 [phone]

## 2019-10-31 ENCOUNTER — Encounter (HOSPITAL_COMMUNITY): Payer: Self-pay

## 2019-10-31 ENCOUNTER — Ambulatory Visit (HOSPITAL_COMMUNITY)
Admission: EM | Admit: 2019-10-31 | Discharge: 2019-10-31 | Disposition: A | Discharge status: Home / Self Care | Payer: HRSA Program | Attending: Internal Medicine | Admitting: Internal Medicine

## 2019-10-31 ENCOUNTER — Other Ambulatory Visit: Payer: Self-pay

## 2019-10-31 DIAGNOSIS — U071 COVID-19: Secondary | ICD-10-CM | POA: Insufficient documentation

## 2019-10-31 DIAGNOSIS — K529 Noninfective gastroenteritis and colitis, unspecified: Secondary | ICD-10-CM

## 2019-10-31 LAB — SARS CORONAVIRUS 2 (TAT 6-24 HRS): SARS Coronavirus 2: POSITIVE — AB

## 2019-10-31 MED ORDER — ONDANSETRON 4 MG PO TBDP
4.0000 mg | ORAL_TABLET | Freq: Once | ORAL | Status: AC
  Administered 2019-10-31: 4 mg via ORAL

## 2019-10-31 MED ORDER — ONDANSETRON 4 MG PO TBDP
ORAL_TABLET | ORAL | Status: AC
  Filled 2019-10-31: qty 1

## 2019-10-31 MED ORDER — ONDANSETRON HCL 4 MG PO TABS: 4.0000 mg | ORAL_TABLET | Freq: Three times a day (TID) | ORAL | 1 refills | Status: AC | PRN
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
## 2019-10-31 NOTE — ED Triage Notes (Signed)
Patient presents today with complaints of nausea, vomiting and diarrhea that began today. Patient states that he went to a party on Sunday, felt like he just had a hang over until today. 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 Patient states no one else is sick in his home or from the party he attended.

## 2019-10-31 NOTE — Discharge Instructions (Addendum)
I have screened you today for the Covid virus.  There is results should be available next 24 hours.  I have prescribed you medication for nausea.  Please take this as needed.  BRAT diet for now (including bananas, rice, applesauce, toast) and advance as tolerated.  Return if symptoms worsen or persist and unable to keep down fluids

## 2019-11-01 ENCOUNTER — Telehealth: Payer: Self-pay | Admitting: Physician Assistant

## 2019-11-01 NOTE — Telephone Encounter (Signed)
I called patient to discuss infusion treatment.  Did not picked up phone. No voice mail set up.

## 2019-11-06 ENCOUNTER — Ambulatory Visit (HOSPITAL_COMMUNITY)
Admission: EM | Admit: 2019-11-06 | Discharge: 2019-11-06 | Disposition: A | Discharge status: Home / Self Care | Payer: HRSA Program | Attending: Family Medicine | Admitting: Family Medicine

## 2019-11-06 ENCOUNTER — Encounter (HOSPITAL_COMMUNITY): Payer: Self-pay

## 2019-11-06 ENCOUNTER — Other Ambulatory Visit: Payer: Self-pay

## 2019-11-06 DIAGNOSIS — U071 COVID-19: Secondary | ICD-10-CM | POA: Diagnosis not present

## 2019-11-06 DIAGNOSIS — R197 Diarrhea, unspecified: Secondary | ICD-10-CM

## 2019-11-06 MED ORDER — PROMETHAZINE HCL 25 MG PO TABS: 25.0000 mg | ORAL_TABLET | Freq: Three times a day (TID) | ORAL | 0 refills | Status: AC | PRN

## 2019-11-06 NOTE — Discharge Instructions (Signed)
Bland diet as tolerated.  Small frequent sips of fluids- Pedialyte, Gatorade, water, broth- to maintain hydration.   May try using phenergan as needed for nausea (may cause drowsiness so may want to use in the evening).  Imodium as needed for diarrhea, do not take more than box instruction.  Please return if worsening of abdominal pain, dizziness, no urine output, return of fevers, or otherwise worsening.  Continue to isolate for a total of 10days from symptoms onset as long as symptoms are improving and no fever for 72 hours.

## 2019-11-06 NOTE — ED Triage Notes (Signed)
Pt is here with a cough that started 3 days ago, pt was seen on 10/31/2019 & tested POSITIVE for COVID, pt feels he needs more meds and to NOT quanirtine. Pt has taken prescribed from 7/13/ visit.

## 2019-11-07 NOTE — ED Provider Notes (Signed)
[facility]    CSN: [id number] Arrival date & time: 11/06/19  1627      History   Chief Complaint Chief Complaint  Patient presents with   Cough   Diarrhea    HPI Alejandro Murphy is a 29 y.o. male.   Alejandro Murphy presents with complaints of diarrhea, nausea, abdominal discomfort, decreased appetite. He tested positive for covid-19 on 7/13 with similar symptoms. They have not necessarily improved. Cough has improved, however. He will feel hungry, but eating then worsens his symptoms. Occasional emesis, typically related to coughing. Has taken zofran some, which helps some. No further fevers. Stool is loose, no blood or black. Urinating.    ROS per HPI, negative if not otherwise mentioned.      Past Medical History:  Diagnosis Date   ADHD (attention deficit hyperactivity disorder)    Depression    Schizoaffective disorder [facility])     Patient Active Problem List   Diagnosis Date Noted   Schizoaffective disorder (HCC) 07/06/2013   OCD (obsessive compulsive disorder) 10/12/2012   Central nervous system complication 07/15/2012   Bipolar I disorder, most recent episode (or current) depressed, unspecified 06/18/2012    Past Surgical History:  Procedure Laterality Date   CHOLECYSTECTOMY  Per pt in 2012   WISDOM TOOTH EXTRACTION Bilateral        Home Medications    Prior to Admission medications   Medication Sig Start Date End Date Taking? Authorizing Provider  lurasidone (LATUDA) 20 MG TABS tablet  08/09/13  Yes [provider]  diclofenac (VOLTAREN) 75 MG EC tablet Take 1 tablet (75 mg total) by mouth 2 (two) times daily. 05/18/16   Elvina Sidle, MD  ondansetron (ZOFRAN) 4 MG tablet Take 1 tablet (4 mg total) by mouth every 8 (eight) hours as needed for nausea or vomiting. 10/31/19 10/30/20  Rolla Etienne, NP  predniSONE (DELTASONE) 20 MG tablet Two daily with food 05/18/16   Elvina Sidle, MD  promethazine (PHENERGAN) 25 MG  tablet Take 1 tablet (25 mg total) by mouth every 8 (eight) hours as needed for nausea or vomiting. 11/06/19   Georgetta Haber, NP    Family History Family History  Problem Relation Age of Onset   Anxiety disorder Mother    Alcohol abuse Father    Dementia Paternal Grandmother        PGGM at age 13   Seizures Paternal Grandmother        PGGM   ADD / ADHD Neg Hx    Bipolar disorder Neg Hx    Depression Neg Hx    Drug abuse Neg Hx    OCD Neg Hx    Paranoid behavior Neg Hx    Schizophrenia Neg Hx     Social History Social History   Tobacco Use   Smoking status: Never Smoker   Smokeless tobacco: Never Used  Vaping Use   Vaping Use: Never used  Substance Use Topics   Alcohol use: Yes    Comment: socially   Drug use: Yes    Types: Marijuana     Allergies   Patient has no known allergies.   Review of Systems Review of Systems   Physical Exam Triage Vital Signs ED Triage Vitals  Enc Vitals Group     BP 11/06/19 1808 117/66     Pulse Rate 11/06/19 1808 75     Resp 11/06/19 1808 19     Temp 11/06/19 1808 99.4 F (37.4 C)  Temp Source 11/06/19 1808 Oral     SpO2 11/06/19 1808 92 %     Weight 11/06/19 1809 281 lb 12.8 oz (127.8 kg)     Height --      Head Circumference --      Peak Flow --      Pain Score 11/06/19 1801 0     Pain Loc --      Pain Edu? --      Excl. in GC? --    No data found.  Updated Vital Signs BP 117/66 (BP Location: Left Arm)    Pulse 75    Temp 99.4 F (37.4 C) (Oral)    Resp 19    Wt 281 lb 12.8 oz (127.8 kg)    SpO2 92%    BMI 39.30 kg/m    Physical Exam Constitutional:      Appearance: He is well-developed. He is ill-appearing.  Cardiovascular:     Rate and Rhythm: Normal rate.  Pulmonary:     Effort: Pulmonary effort is normal.  Abdominal:     Palpations: Abdomen is soft.  Skin:    General: Skin is warm and dry.  Neurological:     Mental Status: He is alert and oriented to person, place, and time.       UC Treatments / Results  Labs (all labs ordered are listed, but only abnormal results are displayed) Labs Reviewed - No data to display  EKG   Radiology No results found.  Procedures Procedures (including critical care time)  Medications Ordered in UC Medications - No data to display  Initial Impression / Assessment and Plan / UC Course  I have reviewed the triage vital signs and the nursing notes.  Pertinent labs & imaging results that were available during my care of the patient were reviewed by me and considered in my medical decision making (see chart for details).     <1 week of symptoms with positive covid-19 testing. Not worsening, no increased fevers. Minimal improvement, primarily gi symptoms at this time. Afebrile, no tachycardia. No increased cough. Noted O2 of 92% on arrival. No increased shortness of breath . Supportive cares recommended. Return precautions provided. Patient verbalized understanding and agreeable to plan.   Final Clinical Impressions(s) / UC Diagnoses   Final diagnoses:  COVID-19 virus infection  Diarrhea, unspecified type     Discharge Instructions     Bland diet as tolerated.  Small frequent sips of fluids- Pedialyte, Gatorade, water, broth- to maintain hydration.   May try using phenergan as needed for nausea (may cause drowsiness so may want to use in the evening).  Imodium as needed for diarrhea, do not take more than box instruction.  Please return if worsening of abdominal pain, dizziness, no urine output, return of fevers, or otherwise worsening.  Continue to isolate for a total of 10days from symptoms onset as long as symptoms are improving and no fever for 72 hours.    ED Prescriptions    Medication Sig Dispense Auth. Provider   promethazine (PHENERGAN) 25 MG tablet Take 1 tablet (25 mg total) by mouth every 8 (eight) hours as needed for nausea or vomiting. 20 tablet Georgetta Haber, NP     PDMP not reviewed this  encounter.   Georgetta Haber, NP 11/07/19 [phone]

## 2020-09-11 ENCOUNTER — Ambulatory Visit (HOSPITAL_COMMUNITY)
Admission: EM | Admit: 2020-09-11 | Discharge: 2020-09-11 | Disposition: A | Discharge status: Home / Self Care | Payer: Self-pay | Attending: Physician Assistant | Admitting: Physician Assistant

## 2020-09-11 ENCOUNTER — Encounter (HOSPITAL_COMMUNITY): Payer: Self-pay

## 2020-09-11 ENCOUNTER — Other Ambulatory Visit: Payer: Self-pay

## 2020-09-11 DIAGNOSIS — R42 Dizziness and giddiness: Secondary | ICD-10-CM

## 2020-09-11 DIAGNOSIS — R0602 Shortness of breath: Secondary | ICD-10-CM

## 2020-09-11 DIAGNOSIS — R1110 Vomiting, unspecified: Secondary | ICD-10-CM

## 2020-09-11 DIAGNOSIS — R059 Cough, unspecified: Secondary | ICD-10-CM

## 2020-09-11 MED ORDER — PREDNISONE 20 MG PO TABS: ORAL_TABLET | ORAL | 0 refills | Status: AC

## 2020-09-11 NOTE — ED Triage Notes (Signed)
Pt in with c/o dizziness, ST, cough congestion  States he inhaled metal fumes at work yesterday

## 2020-09-11 NOTE — Discharge Instructions (Addendum)
I think it is very important that you get a CT scan as we discussed.  If you are trying to arrange this outpatient consider [facility] outpatient imaging.  If you continue to have symptoms I think it is worthwhile to go to the ER as we discussed.  I have called in some prednisone to help with your symptoms.  Please do not take ibuprofen, aspirin, naproxen with this medication as it can cause stomach bleeding.  Make sure you are drinking plenty of fluid.  Go to ER with worsening symptoms.

## 2020-09-11 NOTE — ED Provider Notes (Signed)
[facility]    CSN: [id number] Arrival date & time: 09/11/20  0630      History   Chief Complaint Chief Complaint  Patient presents with  . Nasal Congestion  . Cough  . Dizziness  . Sore Throat    HPI Alejandro Murphy is a 30 y.o. male.   Patient presents today with a several hour history of URI symptoms.  Reports headache, severe dry cough, shortness of breath, sore throat, 1 episode of posttussive emesis.  Denies any fever, chest pain, syncope.  Denies any known sick contacts.  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  He was not wearing appropriate PPE and was working on this for 10 to 12 hours.  He denies any history of asthma, allergies, COPD.  Does not smoke.  Denies any recent antibiotic use.  He has tried some nasal medication without improvement of symptoms.  He has had COVID in the past but has not had COVID-19 vaccination.  He has not had a flu shot.  He is unable to perform daily activities as result of symptoms.  He is concerned about cost as he is currently without insurance.     Past Medical History:  Diagnosis Date  . ADHD (attention deficit hyperactivity disorder)   . Depression   . Schizoaffective disorder [facility])     Patient Active Problem List   Diagnosis Date Noted  . Schizoaffective disorder (HCC) 07/06/2013  . OCD (obsessive compulsive disorder) 10/12/2012  . Central nervous system complication 07/15/2012  . Bipolar I disorder, most recent episode (or current) depressed, unspecified 06/18/2012    Past Surgical History:  Procedure Laterality Date  . CHOLECYSTECTOMY  Per pt in 2012  . WISDOM TOOTH EXTRACTION Bilateral        Home Medications    Prior to Admission medications   Medication Sig Start Date End Date Taking? Authorizing Provider  diclofenac (VOLTAREN) 75 MG EC tablet Take 1 tablet (75 mg total) by mouth 2 (two) times daily. 05/18/16   Elvina Sidle,  MD  lurasidone (LATUDA) 20 MG TABS tablet  08/09/13   [provider]  ondansetron (ZOFRAN) 4 MG tablet Take 1 tablet (4 mg total) by mouth every 8 (eight) hours as needed for nausea or vomiting. 10/31/19 10/30/20  Rolla Etienne, NP  predniSONE (DELTASONE) 20 MG tablet Two daily with food 09/11/20 09/15/20  Helaina Stefano, Noberto Retort, PA-C  promethazine (PHENERGAN) 25 MG tablet Take 1 tablet (25 mg total) by mouth every 8 (eight) hours as needed for nausea or vomiting. 11/06/19   Georgetta Haber, NP    Family History Family History  Problem Relation Age of Onset  . Anxiety disorder Mother   . Alcohol abuse Father   . Dementia Paternal Grandmother        PGGM at age 41  . Seizures Paternal Grandmother        PGGM  . ADD / ADHD Neg Hx   . Bipolar disorder Neg Hx   . Depression Neg Hx   . Drug abuse Neg Hx   . OCD Neg Hx   . Paranoid behavior Neg Hx   . Schizophrenia Neg Hx     Social History Social History   Tobacco Use  . Smoking status: Never Smoker  . Smokeless tobacco: Never Used  Vaping Use  . Vaping Use: Never used  Substance Use Topics  . Alcohol use: Yes  Comment: socially  . Drug use: Yes    Types: Marijuana     Allergies   Patient has no known allergies.   Review of Systems Review of Systems  Constitutional: Positive for activity change and fatigue. Negative for appetite change and fever.  HENT: Positive for congestion and sore throat. Negative for sinus pressure and sneezing.   Respiratory: Positive for cough and shortness of breath.   Cardiovascular: Negative for chest pain.  Gastrointestinal: Positive for nausea and vomiting. Negative for abdominal pain and diarrhea.  Neurological: Positive for headaches. Negative for dizziness and light-headedness.     Physical Exam Triage Vital Signs ED Triage Vitals  Enc Vitals Group     BP 09/11/20 0916 (!) 153/93     Pulse Rate 09/11/20 0916 88     Resp 09/11/20 0916 20     Temp 09/11/20 0916 97.8 F (36.6 C)      Temp Source 09/11/20 0916 Oral     SpO2 09/11/20 0916 97 %     Weight --      Height --      Head Circumference --      Peak Flow --      Pain Score 09/11/20 0914 8     Pain Loc --      Pain Edu? --      Excl. in GC? --    No data found.  Updated Vital Signs BP (!) 153/93 (BP Location: Left Arm)   Pulse 88   Temp 97.8 F (36.6 C) (Oral)   Resp 20   SpO2 97%   Visual Acuity Right Eye Distance:   Left Eye Distance:   Bilateral Distance:    Right Eye Near:   Left Eye Near:    Bilateral Near:     Physical Exam Vitals reviewed.  Constitutional:      General: He is awake.     Appearance: Normal appearance. He is normal weight. He is not ill-appearing.     Comments: Very pleasant male appears stated age in no acute distress  HENT:     Head: Normocephalic and atraumatic.     Right Ear: Ear canal and external ear normal. A middle ear effusion is present. Tympanic membrane is not erythematous or bulging.     Left Ear: Tympanic membrane, ear canal and external ear normal. Tympanic membrane is not erythematous or bulging.     Nose: Nose normal.     Mouth/Throat:     Pharynx: Uvula midline. Posterior oropharyngeal erythema present. No oropharyngeal exudate.     Comments: Moderate erythema and drainage in posterior pharynx Cardiovascular:     Rate and Rhythm: Normal rate and regular rhythm.     Heart sounds: No murmur heard.   Pulmonary:     Effort: Pulmonary effort is normal. No accessory muscle usage or respiratory distress.     Breath sounds: Normal breath sounds. No stridor. No wheezing, rhonchi or rales.     Comments: Reactive cough with deep breathing. Abdominal:     General: Bowel sounds are normal.     Palpations: Abdomen is soft.     Tenderness: There is no abdominal tenderness.  Lymphadenopathy:     Head:     Right side of head: No submental, submandibular or tonsillar adenopathy.     Left side of head: No submental, submandibular or tonsillar adenopathy.      Cervical: No cervical adenopathy.  Neurological:     Mental Status: He is alert.  Psychiatric:  Behavior: Behavior is cooperative.      UC Treatments / Results  Labs (all labs ordered are listed, but only abnormal results are displayed) Labs Reviewed - No data to display  EKG   Radiology No results found.  Procedures Procedures (including critical care time)  Medications Ordered in UC Medications - No data to display  Initial Impression / Assessment and Plan / UC Course  I have reviewed the triage vital signs and the nursing notes.  Pertinent labs & imaging results that were available during my care of the patient were reviewed by me and considered in my medical decision making (see chart for details).     Discussed that we are unable to obtain CT imaging to rule out possible exposure to metal vapors and associated lung injury.  Discussed potentially going down to the ER to have this obtained the patient declined this due to concern for cost.  He will try to arrange this outpatient and we will establish him with PCP through PCP assistance.  He was given short course of prednisone with instruction not to take NSAIDs to help manage symptoms.  Discussed potential utility of testing for COVID; patient has at home COVID test and preferred to use this due to concern for cost and declined testing with our office today.  Recommend he continue using over-the-counter medications for symptom management.  Discussed the importance of being evaluated by pulmonology soon if there is concern for inhaling metal vapors.  Patient expressed understanding will try to arrange this.  Strict return precautions given to which patient expressed understanding.  Final Clinical Impressions(s) / UC Diagnoses   Final diagnoses:  Cough  Post-tussive emesis  Dizziness  Shortness of breath     Discharge Instructions     I think it is very important that you get a CT scan as we discussed.  If  you are trying to arrange this outpatient consider [facility] outpatient imaging.  If you continue to have symptoms I think it is worthwhile to go to the ER as we discussed.  I have called in some prednisone to help with your symptoms.  Please do not take ibuprofen, aspirin, naproxen with this medication as it can cause stomach bleeding.  Make sure you are drinking plenty of fluid.  Go to ER with worsening symptoms.    ED Prescriptions    Medication Sig Dispense Auth. Provider   predniSONE (DELTASONE) 20 MG tablet Two daily with food 8 tablet Murna Backer K, PA-C     PDMP not reviewed this encounter.   Jeani Hawking, PA-C 09/11/20 [id number]

## 2020-09-16 ENCOUNTER — Encounter: Payer: Self-pay | Admitting: *Deleted
